# Patient Record
Sex: Male | Born: 1980 | Race: Black or African American | Hispanic: No | Marital: Married | Smoking: Former smoker
Health system: Southern US, Community
[De-identification: ages and names within clinical notes are randomized; demographics above are authoritative.]

## PROBLEM LIST (undated history)

## (undated) DIAGNOSIS — R519 Headache, unspecified: Secondary | ICD-10-CM

## (undated) DIAGNOSIS — F419 Anxiety disorder, unspecified: Secondary | ICD-10-CM

## (undated) DIAGNOSIS — I10 Essential (primary) hypertension: Secondary | ICD-10-CM

## (undated) DIAGNOSIS — R51 Headache: Secondary | ICD-10-CM

## (undated) DIAGNOSIS — K219 Gastro-esophageal reflux disease without esophagitis: Secondary | ICD-10-CM

## (undated) HISTORY — PX: WISDOM TOOTH EXTRACTION: SHX21

---
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
## 2002-03-06 ENCOUNTER — Encounter: Payer: Self-pay | Admitting: Cardiology

## 2002-03-06 ENCOUNTER — Ambulatory Visit (HOSPITAL_COMMUNITY): Admission: RE | Admit: 2002-03-06 | Discharge: 2002-03-06 | Payer: Self-pay | Admitting: Cardiology

## 2002-03-06 ENCOUNTER — Inpatient Hospital Stay (HOSPITAL_COMMUNITY): Admission: EM | Admit: 2002-03-06 | Discharge: 2002-03-11 | Payer: Self-pay | Admitting: *Deleted

## 2002-03-12 ENCOUNTER — Encounter (HOSPITAL_COMMUNITY): Admission: RE | Admit: 2002-03-12 | Discharge: 2002-06-10 | Payer: Self-pay | Admitting: Cardiology

## 2002-05-11 ENCOUNTER — Encounter: Admission: RE | Admit: 2002-05-11 | Discharge: 2002-05-11 | Payer: Self-pay | Admitting: Internal Medicine

## 2002-05-21 ENCOUNTER — Encounter: Admission: RE | Admit: 2002-05-21 | Discharge: 2002-05-21 | Payer: Self-pay | Admitting: Internal Medicine

## 2002-06-01 ENCOUNTER — Encounter: Admission: RE | Admit: 2002-06-01 | Discharge: 2002-06-01 | Payer: Self-pay | Admitting: Internal Medicine

## 2002-06-08 ENCOUNTER — Encounter: Admission: RE | Admit: 2002-06-08 | Discharge: 2002-06-08 | Payer: Self-pay | Admitting: Internal Medicine

## 2002-06-22 ENCOUNTER — Encounter: Admission: RE | Admit: 2002-06-22 | Discharge: 2002-06-22 | Payer: Self-pay | Admitting: Internal Medicine

## 2002-07-06 ENCOUNTER — Encounter: Admission: RE | Admit: 2002-07-06 | Discharge: 2002-07-06 | Payer: Self-pay | Admitting: Internal Medicine

## 2002-08-03 ENCOUNTER — Encounter: Admission: RE | Admit: 2002-08-03 | Discharge: 2002-08-03 | Payer: Self-pay | Admitting: Internal Medicine

## 2002-09-07 ENCOUNTER — Encounter: Admission: RE | Admit: 2002-09-07 | Discharge: 2002-09-07 | Payer: Self-pay | Admitting: Internal Medicine

## 2002-11-05 ENCOUNTER — Encounter: Admission: RE | Admit: 2002-11-05 | Discharge: 2002-11-05 | Payer: Self-pay | Admitting: Internal Medicine

## 2002-12-31 ENCOUNTER — Encounter: Admission: RE | Admit: 2002-12-31 | Discharge: 2002-12-31 | Payer: Self-pay | Admitting: Internal Medicine

## 2003-02-03 ENCOUNTER — Emergency Department (HOSPITAL_COMMUNITY): Admission: EM | Admit: 2003-02-03 | Discharge: 2003-02-03 | Payer: Self-pay | Admitting: Emergency Medicine

## 2003-04-14 ENCOUNTER — Encounter: Admission: RE | Admit: 2003-04-14 | Discharge: 2003-04-14 | Payer: Self-pay | Admitting: Internal Medicine

## 2003-04-26 ENCOUNTER — Encounter: Admission: RE | Admit: 2003-04-26 | Discharge: 2003-04-26 | Payer: Self-pay | Admitting: Cardiology

## 2003-04-26 ENCOUNTER — Encounter: Payer: Self-pay | Admitting: Cardiology

## 2003-06-13 ENCOUNTER — Emergency Department (HOSPITAL_COMMUNITY): Admission: EM | Admit: 2003-06-13 | Discharge: 2003-06-13 | Payer: Self-pay | Admitting: Emergency Medicine

## 2003-06-13 ENCOUNTER — Encounter: Payer: Self-pay | Admitting: Emergency Medicine

## 2003-08-30 ENCOUNTER — Encounter: Admission: RE | Admit: 2003-08-30 | Discharge: 2003-08-30 | Payer: Self-pay | Admitting: Internal Medicine

## 2005-07-15 ENCOUNTER — Emergency Department (HOSPITAL_COMMUNITY): Admission: EM | Admit: 2005-07-15 | Discharge: 2005-07-15 | Payer: Self-pay | Admitting: Emergency Medicine

## 2006-11-18 ENCOUNTER — Emergency Department (HOSPITAL_COMMUNITY): Admission: EM | Admit: 2006-11-18 | Discharge: 2006-11-18 | Payer: Self-pay | Admitting: Emergency Medicine

## 2006-11-27 ENCOUNTER — Ambulatory Visit: Payer: Self-pay | Admitting: Pulmonary Disease

## 2006-11-27 LAB — CONVERTED CEMR LAB
Albumin: 4.1 g/dL (ref 3.5–5.2)
Basophils Absolute: 0 10*3/uL (ref 0.0–0.1)
Bilirubin, Direct: 0.2 mg/dL (ref 0.0–0.3)
CO2: 32 meq/L (ref 19–32)
Calcium: 9.8 mg/dL (ref 8.4–10.5)
Creatinine, Ser: 0.9 mg/dL (ref 0.4–1.5)
Eosinophils Relative: 2.7 % (ref 0.0–5.0)
GFR calc Af Amer: 131 mL/min
GFR calc non Af Amer: 108 mL/min
Glucose, Bld: 145 mg/dL — ABNORMAL HIGH (ref 70–99)
Ketones, ur: NEGATIVE mg/dL
LDL Cholesterol: 144 mg/dL — ABNORMAL HIGH (ref 0–99)
Leukocytes, UA: NEGATIVE
Monocytes Absolute: 0.5 10*3/uL (ref 0.2–0.7)
Monocytes Relative: 9.2 % (ref 3.0–11.0)
Neutrophils Relative %: 45.9 % (ref 43.0–77.0)
Platelets: 298 10*3/uL (ref 150–400)
Potassium: 4.5 meq/L (ref 3.5–5.1)
RBC: 5.3 M/uL (ref 4.22–5.81)
RDW: 13.1 % (ref 11.5–14.6)
Sodium: 142 meq/L (ref 135–145)
Total Bilirubin: 1.4 mg/dL — ABNORMAL HIGH (ref 0.3–1.2)
Total Protein: 7.3 g/dL (ref 6.0–8.3)
Triglycerides: 75 mg/dL (ref 0–149)
Urine Glucose: NEGATIVE mg/dL
Urobilinogen, UA: 0.2 (ref 0.0–1.0)
WBC: 5.4 10*3/uL (ref 4.5–10.5)

## 2007-04-08 ENCOUNTER — Emergency Department (HOSPITAL_COMMUNITY): Admission: EM | Admit: 2007-04-08 | Discharge: 2007-04-08 | Payer: Self-pay | Admitting: Emergency Medicine

## 2007-04-09 ENCOUNTER — Emergency Department (HOSPITAL_COMMUNITY): Admission: EM | Admit: 2007-04-09 | Discharge: 2007-04-09 | Payer: Self-pay | Admitting: Emergency Medicine

## 2008-03-17 ENCOUNTER — Ambulatory Visit: Payer: Self-pay | Admitting: Internal Medicine

## 2008-03-17 DIAGNOSIS — I10 Essential (primary) hypertension: Secondary | ICD-10-CM | POA: Insufficient documentation

## 2008-03-17 DIAGNOSIS — M549 Dorsalgia, unspecified: Secondary | ICD-10-CM | POA: Insufficient documentation

## 2008-03-18 LAB — CONVERTED CEMR LAB
AST: 23 units/L (ref 0–37)
BUN: 10 mg/dL (ref 6–23)
Bilirubin Urine: NEGATIVE
Bilirubin, Direct: 0.2 mg/dL (ref 0.0–0.3)
Calcium: 8.5 mg/dL (ref 8.4–10.5)
Creatinine, Ser: 1.1 mg/dL (ref 0.4–1.5)
Eosinophils Relative: 2.4 % (ref 0.0–5.0)
GFR calc Af Amer: 103 mL/min
GFR calc non Af Amer: 85 mL/min
Hemoglobin, Urine: NEGATIVE
MCHC: 34.2 g/dL (ref 30.0–36.0)
Monocytes Absolute: 0.4 10*3/uL (ref 0.1–1.0)
Monocytes Relative: 7.3 % (ref 3.0–12.0)
Neutro Abs: 2.1 10*3/uL (ref 1.4–7.7)
Platelets: 258 10*3/uL (ref 150–400)
Potassium: 3.8 meq/L (ref 3.5–5.1)
RBC: 4.61 M/uL (ref 4.22–5.81)
Urine Glucose: NEGATIVE mg/dL
WBC: 5 10*3/uL (ref 4.5–10.5)

## 2008-05-07 ENCOUNTER — Ambulatory Visit: Payer: Self-pay | Admitting: Pulmonary Disease

## 2008-05-07 DIAGNOSIS — R748 Abnormal levels of other serum enzymes: Secondary | ICD-10-CM | POA: Insufficient documentation

## 2008-05-07 DIAGNOSIS — E663 Overweight: Secondary | ICD-10-CM | POA: Insufficient documentation

## 2008-05-07 DIAGNOSIS — E7800 Pure hypercholesterolemia, unspecified: Secondary | ICD-10-CM | POA: Insufficient documentation

## 2008-05-19 ENCOUNTER — Emergency Department (HOSPITAL_COMMUNITY): Admission: EM | Admit: 2008-05-19 | Discharge: 2008-05-19 | Payer: Self-pay | Admitting: Emergency Medicine

## 2008-09-04 ENCOUNTER — Emergency Department (HOSPITAL_COMMUNITY): Admission: EM | Admit: 2008-09-04 | Discharge: 2008-09-04 | Payer: Self-pay | Admitting: Emergency Medicine

## 2008-09-24 ENCOUNTER — Ambulatory Visit: Payer: Self-pay | Admitting: Gastroenterology

## 2008-09-24 DIAGNOSIS — K219 Gastro-esophageal reflux disease without esophagitis: Secondary | ICD-10-CM | POA: Insufficient documentation

## 2008-09-27 LAB — CONVERTED CEMR LAB
AST: 20 units/L (ref 0–37)
Albumin: 4.2 g/dL (ref 3.5–5.2)
Alkaline Phosphatase: 52 units/L (ref 39–117)
Bilirubin, Direct: 0.2 mg/dL (ref 0.0–0.3)
Total Bilirubin: 1.5 mg/dL — ABNORMAL HIGH (ref 0.3–1.2)

## 2008-09-30 ENCOUNTER — Ambulatory Visit: Payer: Self-pay | Admitting: Cardiology

## 2008-11-02 ENCOUNTER — Telehealth (INDEPENDENT_AMBULATORY_CARE_PROVIDER_SITE_OTHER): Payer: Self-pay | Admitting: *Deleted

## 2008-11-16 ENCOUNTER — Ambulatory Visit: Payer: Self-pay | Admitting: Internal Medicine

## 2008-11-23 DIAGNOSIS — J069 Acute upper respiratory infection, unspecified: Secondary | ICD-10-CM | POA: Insufficient documentation

## 2008-12-08 ENCOUNTER — Telehealth: Payer: Self-pay | Admitting: Pulmonary Disease

## 2009-01-28 ENCOUNTER — Telehealth (INDEPENDENT_AMBULATORY_CARE_PROVIDER_SITE_OTHER): Payer: Self-pay | Admitting: *Deleted

## 2009-01-28 ENCOUNTER — Telehealth: Payer: Self-pay | Admitting: Gastroenterology

## 2009-01-28 ENCOUNTER — Ambulatory Visit: Payer: Self-pay | Admitting: Gastroenterology

## 2009-01-31 LAB — CONVERTED CEMR LAB
Basophils Absolute: 0 10*3/uL (ref 0.0–0.1)
HCT: 43.9 % (ref 39.0–52.0)
Lymphocytes Relative: 47.4 % — ABNORMAL HIGH (ref 12.0–46.0)
Lymphs Abs: 1.6 10*3/uL (ref 0.7–4.0)
Monocytes Absolute: 0.9 10*3/uL (ref 0.1–1.0)
Platelets: 187 10*3/uL (ref 150.0–400.0)
RBC: 5.01 M/uL (ref 4.22–5.81)
WBC: 3.5 10*3/uL — ABNORMAL LOW (ref 4.5–10.5)

## 2009-03-01 ENCOUNTER — Ambulatory Visit: Payer: Self-pay | Admitting: Pulmonary Disease

## 2009-03-01 DIAGNOSIS — F411 Generalized anxiety disorder: Secondary | ICD-10-CM | POA: Insufficient documentation

## 2009-03-15 ENCOUNTER — Ambulatory Visit: Payer: Self-pay | Admitting: Internal Medicine

## 2009-03-15 DIAGNOSIS — S139XXA Sprain of joints and ligaments of unspecified parts of neck, initial encounter: Secondary | ICD-10-CM | POA: Insufficient documentation

## 2009-03-16 ENCOUNTER — Telehealth (INDEPENDENT_AMBULATORY_CARE_PROVIDER_SITE_OTHER): Payer: Self-pay | Admitting: *Deleted

## 2009-03-16 LAB — CONVERTED CEMR LAB
Alkaline Phosphatase: 57 units/L (ref 39–117)
BUN: 14 mg/dL (ref 6–23)
Basophils Relative: 0.7 % (ref 0.0–3.0)
Chloride: 110 meq/L (ref 96–112)
Creatinine, Ser: 1 mg/dL (ref 0.4–1.5)
GFR calc non Af Amer: 114.12 mL/min (ref >60)
Glucose, Bld: 108 mg/dL — ABNORMAL HIGH (ref 70–99)
HCT: 42.5 % (ref 39.0–52.0)
Lymphocytes Relative: 45.8 % (ref 12.0–46.0)
MCV: 87.5 fL (ref 78.0–100.0)
Monocytes Relative: 7.9 % (ref 3.0–12.0)
Platelets: 239 10*3/uL (ref 150.0–400.0)
Potassium: 4.5 meq/L (ref 3.5–5.1)
RBC: 4.86 M/uL (ref 4.22–5.81)
RDW: 12.9 % (ref 11.5–14.6)
TSH: 0.29 microintl units/mL — ABNORMAL LOW (ref 0.35–5.50)
WBC: 3.9 10*3/uL — ABNORMAL LOW (ref 4.5–10.5)

## 2009-03-17 ENCOUNTER — Ambulatory Visit: Payer: Self-pay | Admitting: Pulmonary Disease

## 2009-03-17 LAB — CONVERTED CEMR LAB: Free T4: 0.6 ng/dL (ref 0.6–1.6)

## 2009-03-29 ENCOUNTER — Ambulatory Visit: Payer: Self-pay | Admitting: Pulmonary Disease

## 2009-04-05 DIAGNOSIS — J309 Allergic rhinitis, unspecified: Secondary | ICD-10-CM | POA: Insufficient documentation

## 2009-05-05 ENCOUNTER — Telehealth: Payer: Self-pay | Admitting: Pulmonary Disease

## 2009-07-11 ENCOUNTER — Telehealth (INDEPENDENT_AMBULATORY_CARE_PROVIDER_SITE_OTHER): Payer: Self-pay | Admitting: *Deleted

## 2009-07-14 ENCOUNTER — Encounter: Payer: Self-pay | Admitting: Adult Health

## 2009-07-14 ENCOUNTER — Ambulatory Visit: Payer: Self-pay | Admitting: Pulmonary Disease

## 2009-07-14 ENCOUNTER — Ambulatory Visit: Payer: Self-pay | Admitting: Internal Medicine

## 2009-07-14 LAB — CONVERTED CEMR LAB
AST: 22 units/L (ref 0–37)
Alkaline Phosphatase: 45 units/L (ref 39–117)
Basophils Absolute: 0 10*3/uL (ref 0.0–0.1)
Basophils Relative: 0.3 % (ref 0.0–3.0)
Bilirubin Urine: NEGATIVE
Bilirubin, Direct: 0.2 mg/dL (ref 0.0–0.3)
Calcium: 9.2 mg/dL (ref 8.4–10.5)
Cholesterol: 150 mg/dL (ref 0–200)
GFR calc non Af Amer: 113.85 mL/min (ref >60)
Glucose, Bld: 118 mg/dL — ABNORMAL HIGH (ref 70–99)
HDL: 31.6 mg/dL — ABNORMAL LOW (ref >39.00)
Hemoglobin, Urine: NEGATIVE
Ketones, ur: NEGATIVE mg/dL
LDL Cholesterol: 112 mg/dL — ABNORMAL HIGH (ref 0–99)
Leukocytes, UA: NEGATIVE
Lymphocytes Relative: 38.9 % (ref 12.0–46.0)
Lymphs Abs: 1.6 10*3/uL (ref 0.7–4.0)
MCV: 88.6 fL (ref 78.0–100.0)
Monocytes Relative: 7.6 % (ref 3.0–12.0)
Neutro Abs: 2.1 10*3/uL (ref 1.4–7.7)
RBC: 4.84 M/uL (ref 4.22–5.81)
Sodium: 141 meq/L (ref 135–145)
Total Protein, Urine: NEGATIVE mg/dL
Triglycerides: 30 mg/dL (ref 0.0–149.0)
Urine Glucose: NEGATIVE mg/dL
Urobilinogen, UA: 1 (ref 0.0–1.0)
VLDL: 6 mg/dL (ref 0.0–40.0)
pH: 6 (ref 5.0–8.0)

## 2009-07-18 DIAGNOSIS — IMO0002 Reserved for concepts with insufficient information to code with codable children: Secondary | ICD-10-CM | POA: Insufficient documentation

## 2009-08-05 ENCOUNTER — Telehealth (INDEPENDENT_AMBULATORY_CARE_PROVIDER_SITE_OTHER): Payer: Self-pay | Admitting: *Deleted

## 2009-08-08 ENCOUNTER — Emergency Department (HOSPITAL_COMMUNITY): Admission: EM | Admit: 2009-08-08 | Discharge: 2009-08-08 | Payer: Self-pay | Admitting: Emergency Medicine

## 2009-08-16 ENCOUNTER — Telehealth: Payer: Self-pay | Admitting: Pulmonary Disease

## 2009-10-03 ENCOUNTER — Telehealth (INDEPENDENT_AMBULATORY_CARE_PROVIDER_SITE_OTHER): Payer: Self-pay | Admitting: *Deleted

## 2009-10-31 ENCOUNTER — Telehealth: Payer: Self-pay | Admitting: Pulmonary Disease

## 2009-11-14 ENCOUNTER — Encounter: Payer: Self-pay | Admitting: Adult Health

## 2009-11-14 ENCOUNTER — Ambulatory Visit: Payer: Self-pay | Admitting: Pulmonary Disease

## 2009-11-14 DIAGNOSIS — J019 Acute sinusitis, unspecified: Secondary | ICD-10-CM | POA: Insufficient documentation

## 2009-11-15 ENCOUNTER — Telehealth (INDEPENDENT_AMBULATORY_CARE_PROVIDER_SITE_OTHER): Payer: Self-pay | Admitting: *Deleted

## 2009-12-21 ENCOUNTER — Ambulatory Visit: Payer: Self-pay | Admitting: Pulmonary Disease

## 2010-01-02 ENCOUNTER — Ambulatory Visit: Payer: Self-pay | Admitting: Professional

## 2010-01-05 ENCOUNTER — Telehealth (INDEPENDENT_AMBULATORY_CARE_PROVIDER_SITE_OTHER): Payer: Self-pay | Admitting: *Deleted

## 2010-01-06 ENCOUNTER — Encounter: Payer: Self-pay | Admitting: Adult Health

## 2010-01-20 ENCOUNTER — Encounter: Payer: Self-pay | Admitting: Adult Health

## 2010-03-07 ENCOUNTER — Ambulatory Visit: Payer: Self-pay | Admitting: Pulmonary Disease

## 2010-03-10 LAB — CONVERTED CEMR LAB
CO2: 31 meq/L (ref 19–32)
GFR calc non Af Amer: 123.24 mL/min (ref >60)
Hgb A1c MFr Bld: 6.9 % — ABNORMAL HIGH (ref 4.6–6.5)

## 2010-03-14 ENCOUNTER — Telehealth (INDEPENDENT_AMBULATORY_CARE_PROVIDER_SITE_OTHER): Payer: Self-pay | Admitting: *Deleted

## 2010-06-11 ENCOUNTER — Emergency Department (HOSPITAL_COMMUNITY): Admission: EM | Admit: 2010-06-11 | Discharge: 2010-06-11 | Payer: Self-pay | Admitting: Emergency Medicine

## 2010-06-18 IMAGING — CR DG CHEST 2V
2 series · 2 of 2 positions shown · non-contrast
Comparison: 11/18/2006

CLINICAL DATA: Pain.

CHEST - 2 VIEW

[w chest pa]
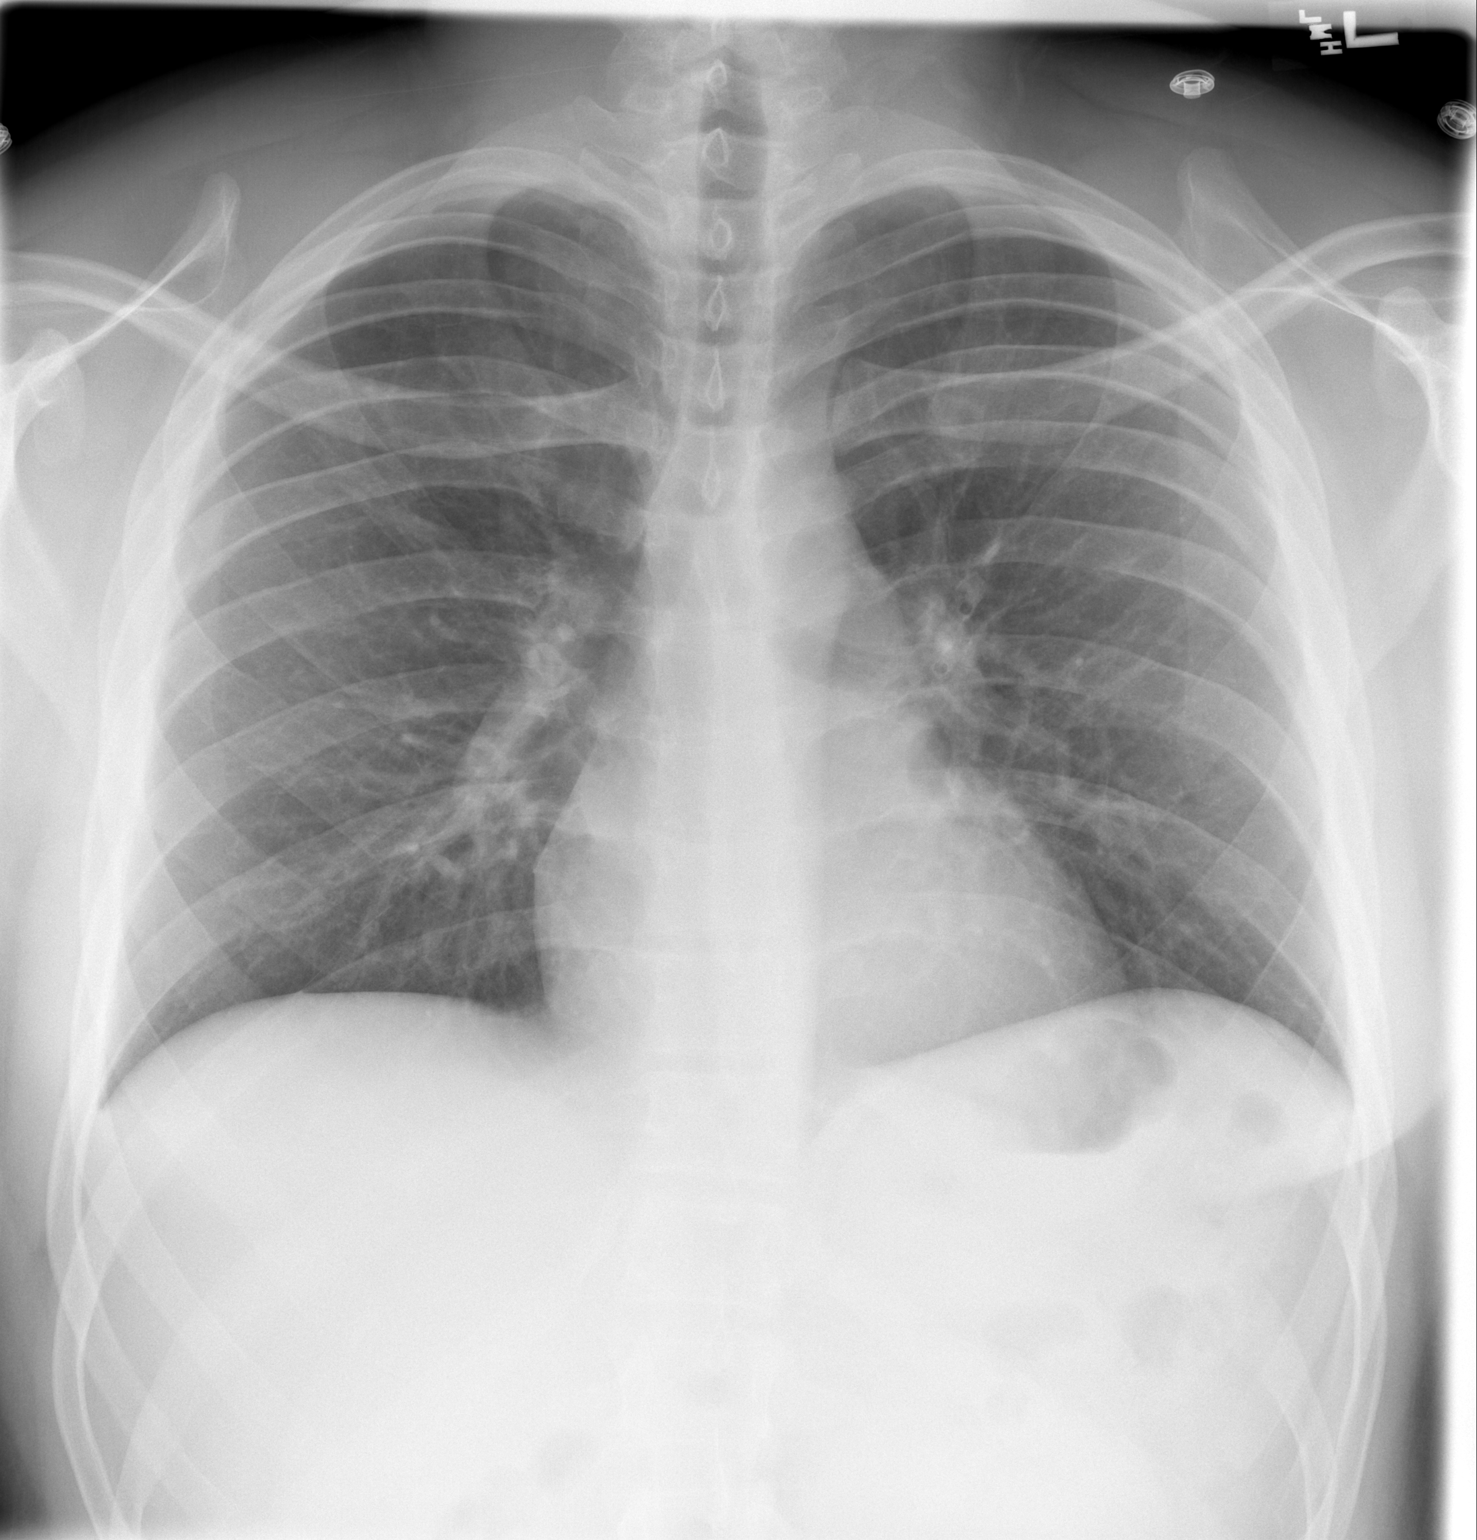

[w chest lat]
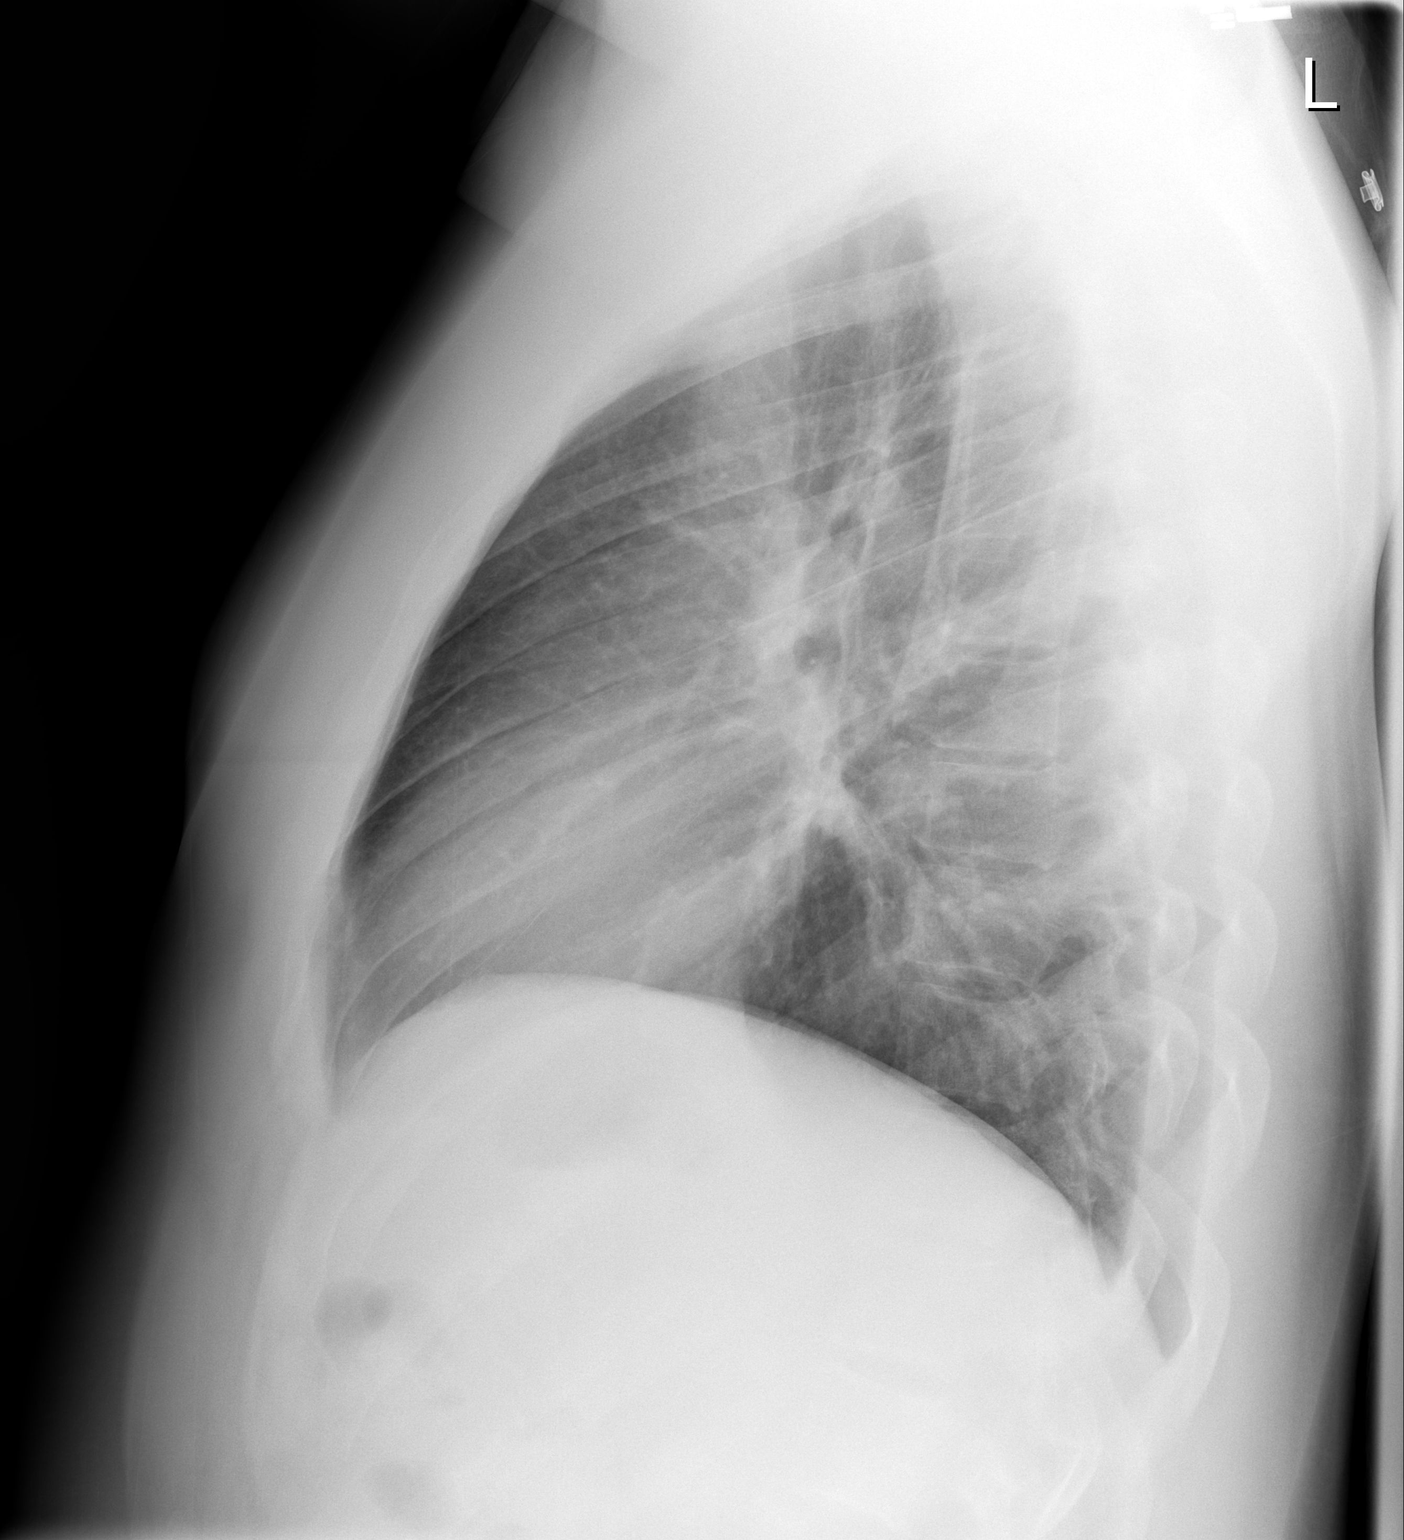

[2 of 2 positions shown; findings below may reference images not displayed]

FINDINGS: The cardiomediastinal silhouette is unremarkable.
The lungs are clear.
There is no evidence of focal airspace disease, pleural effusions,
or pneumothorax.
No acute bony abnormalities are identified.
IMPRESSION: No evidence of acute cardiopulmonary disease.

## 2010-08-29 ENCOUNTER — Telehealth: Payer: Self-pay | Admitting: Pulmonary Disease
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
## 2010-11-14 NOTE — Progress Notes (Signed)
Summary: TALK TO NURSE  Phone Note Call from Patient   Caller: Patient Call For: NADEL Summary of Call: PT WOULD LIKE TO TALK TO NURSE ABOUT LABS ON A1C AND DR NADEL'S COMMENTS Initial call taken by: Rickard Patience,  Mar 14, 2010 3:02 PM  Follow-up for Phone Call        unable to reach pt at either number.  will try back. Randell Loop CMA  Mar 14, 2010 4:27 PM    called and spoke with pt and he is aware that the metformin has been called to the pharmacy Randell Loop CMA  March 15, 2010 5:12 PM
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
## 2010-11-14 NOTE — Progress Notes (Signed)
Summary: medication  Phone Note Call from Patient   Caller: Patient Call For: nadel Summary of Call: med was not called to pharmacy from yesterday's ov [facility] Initial call taken by: Rickard Patience,  November 15, 2009 8:48 AM  Follow-up for Phone Call        pt just saw TP yesterday 11-14-2009.  Per pt instructions, pt was to take Omnicef 300mg  1 two times a day for 10 days.  Rx was never called in.  LMOM informing pt rx called into pharmacy.  Aundra Millet Reynolds LPN  November 15, 2009 8:58 AM     New/Updated Medications: CEFDINIR 300 MG CAPS (CEFDINIR) Take 1 tablet by mouth two times a day for 10 days Prescriptions: CEFDINIR 300 MG CAPS (CEFDINIR) Take 1 tablet by mouth two times a day for 10 days  #20 x 0   Entered by:   Arman Filter LPN   Authorized by:   Rubye Oaks NP   Signed by:   Arman Filter LPN on 09/81/1914   Method used:   Electronically to        [facility] (retail)       [address]       [place], [place]  [address]       Ph: [phone]       Fax: [phone]   RxID:   [id number]

## 2010-11-14 NOTE — Letter (Signed)
Summary: [facility]  [address]   [place], [place] [address]   Phone: [phone]  Fax: [phone]    01/06/2010  PATIENT NAME:   Scott Thomas   To Whom It May Concern,  The above named patient had a yearly physical appointment September 2010.  The physical exam, lab work and chest x-ray were unremarkable.     Sincerely,      Mysti Haley, N.P.

## 2010-11-14 NOTE — Progress Notes (Signed)
Summary: fax  Phone Note Call from Patient   Caller: Patient Call For: nadel Summary of Call: need letter stating he has had cpx and the results fax to his job at [phone] Initial call taken by: Rickard Patience,  January 05, 2010 12:04 PM  Follow-up for Phone Call        Pt needs a letter stating that he had a physical in Sept and what labs he had done and cxr and that all were normal. He needs this for his work. Pt states it can be faxed at number given when complete. Carron Curie CMA  January 05, 2010 3:02 PM   Additional Follow-up for Phone Call Additional follow up Details #1::        can set up letter, physical performed 9/10, phyiscal lab/cxr were unremarkable.   Additional Follow-up by: Rubye Oaks NP,  January 05, 2010 3:59 PM    Additional Follow-up for Phone Call Additional follow up Details #2::    letter typed and printed, stamped w/ TP's per (okayed per TP).  ATC fax letter, but # went to a voicemail.  ATC pt at work to verify fax #, LM w/ coworker TCB. Boone Master CNA  January 06, 2010 10:37 AM   attempted to fax letter again.  still directed to someone's VM.  ATC pt at work again, [employer] w/ coworker TCB again.   Boone Master CNA  January 09, 2010 2:55 PM   pt returned my call.  verified fax number and informed pt that it directs me to a VM.  pt stated he will find another fax number and call me back. Boone Master CNA  January 09, 2010 3:03 PM   i have still not heard from patient regarding a correct fax number for the letter that he has requested.  will sign off on message and await pt's call.  the signed letter will be kept at my desk in my "to do" folder. Boone Master CNA  January 11, 2010 9:20 AM

## 2010-11-14 NOTE — Letter (Signed)
Summary: Out of Work  [employer]  [address]   [place], [place] [address]   Phone: [phone]  Fax: [phone]    November 14, 2009   Employee:  Rodgerick A Allerton    To Whom It May Concern:   For Medical reasons, please excuse the above named employee from work for the following dates:  Start:   Monday November 14, 2009  End:   Tuesday November 15, 2009  If you need additional information, please feel free to contact our office.         Sincerely,      Tammy Parrett, N.P.

## 2010-11-14 NOTE — Progress Notes (Signed)
Summary: SINUS/ EARS  Phone Note Call from Patient   Caller: Patient Call For: Scott Thomas Summary of Call: PT C/O SINUS CONGESTION W/ EAR PRESSURE X 4 DAYS. NO FEVER. SAYS LAST TIME THIS HAPPENED TP CALLED IN A ZPAC WHICH WORKED WELL FOR PT. [facility] [address]. PT # [phone] Initial call taken by: Tivis Ringer, CNA,  August 29, 2010 2:53 PM  Follow-up for Phone Call        called spoke with patient who c/o sinus pressure/congestion and right ear pressure, light green nasal drainage, and PND x4days - denies f/c/s.  requests zpak as this has worked well for him in the past.  nkda.  [facility] [place].  last ov 5.24.11 w/ SN, upcoming 2.15.12. Boone Master CNA/MA  August 29, 2010 3:13 PM   Additional Follow-up for Phone Call Additional follow up Details #1::        per SN--ok for pt to have zpak #1  tad and use mucinex with plenty of fluids. called and spoke with pt and he is aware of meds sent to the pharmacy Randell Loop CMA  August 29, 2010 5:13 PM     New/Updated Medications: ZITHROMAX Z-PAK 250 MG TABS (AZITHROMYCIN) take as directed MUCINEX 600 MG XR12H-TAB (GUAIFENESIN) take one tablet by mouth two times a day with plenty of fluids Prescriptions: ZITHROMAX Z-PAK 250 MG TABS (AZITHROMYCIN) take as directed  #1 pak x 0   Entered by:   Randell Loop CMA   Authorized by:   Michele Mcalpine MD   Signed by:   Randell Loop CMA on 08/29/2010   Method used:   Electronically to        [facility] (retail)       [address]       [place], [place]  [address]       Ph: [phone]       Fax: [phone]   RxID:   [id number]

## 2010-11-14 NOTE — Progress Notes (Signed)
Summary: cough and congestion  Phone Note Call from Patient   Caller: Patient Call For: Scott Thomas Summary of Call: need something for cough green congestion [facility] [place] Initial call taken by: Rickard Patience,  October 31, 2009 9:39 AM  Follow-up for Phone Call        pt c/o nasal congestion, chest congestion with productive cough with green phlegm x 2 days. please advise. Carron Curie CMA  October 31, 2009 9:44 AM    per SN--ok for pt to have augmentin 875mg    #14  1 by mouth two times a day and use mucinex max 1 by mouth two times a day with plenty of fluids.  thanks Randell Loop CMA  October 31, 2009 12:28 PM   rx sent. pt advised. Carron Curie CMA  October 31, 2009 12:58 PM     New/Updated Medications: AUGMENTIN 875-125 MG TABS (AMOXICILLIN-POT CLAVULANATE) Take 1 tablet by mouth two times a day Prescriptions: AUGMENTIN 875-125 MG TABS (AMOXICILLIN-POT CLAVULANATE) Take 1 tablet by mouth two times a day  #14 x 0   Entered by:   Carron Curie CMA   Authorized by:   Michele Mcalpine MD   Signed by:   Carron Curie CMA on 10/31/2009   Method used:   Electronically to        [facility] (retail)       [address]       [place], [place]  [address]       Ph: [phone]       Fax: [phone]   RxID:   [id number]
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
## 2010-11-14 NOTE — Letter (Signed)
Summary: Physical exam form/[facility]  Physical exam form/[facility]   Imported By: Sherian Rein 02/01/2010 08:55:41  _____________________________________________________________________  External Attachment:    Type:   Image     Comment:   External Document

## 2010-11-29 ENCOUNTER — Ambulatory Visit (INDEPENDENT_AMBULATORY_CARE_PROVIDER_SITE_OTHER): Payer: Self-pay | Admitting: Pulmonary Disease

## 2010-11-29 ENCOUNTER — Encounter: Payer: Self-pay | Admitting: Pulmonary Disease

## 2010-11-29 DIAGNOSIS — I10 Essential (primary) hypertension: Secondary | ICD-10-CM

## 2010-11-29 DIAGNOSIS — F411 Generalized anxiety disorder: Secondary | ICD-10-CM

## 2010-11-29 DIAGNOSIS — K219 Gastro-esophageal reflux disease without esophagitis: Secondary | ICD-10-CM

## 2010-11-29 DIAGNOSIS — E663 Overweight: Secondary | ICD-10-CM

## 2010-11-29 DIAGNOSIS — R7309 Other abnormal glucose: Secondary | ICD-10-CM

## 2010-11-29 DIAGNOSIS — M549 Dorsalgia, unspecified: Secondary | ICD-10-CM

## 2010-11-29 DIAGNOSIS — E785 Hyperlipidemia, unspecified: Secondary | ICD-10-CM

## 2010-12-01 ENCOUNTER — Encounter (INDEPENDENT_AMBULATORY_CARE_PROVIDER_SITE_OTHER): Payer: Self-pay | Admitting: *Deleted

## 2010-12-01 ENCOUNTER — Other Ambulatory Visit: Payer: Self-pay

## 2010-12-01 ENCOUNTER — Other Ambulatory Visit: Payer: Self-pay | Admitting: Pulmonary Disease

## 2010-12-01 DIAGNOSIS — E119 Type 2 diabetes mellitus without complications: Secondary | ICD-10-CM

## 2010-12-01 DIAGNOSIS — D649 Anemia, unspecified: Secondary | ICD-10-CM

## 2010-12-01 DIAGNOSIS — E7800 Pure hypercholesterolemia, unspecified: Secondary | ICD-10-CM

## 2010-12-01 DIAGNOSIS — R748 Abnormal levels of other serum enzymes: Secondary | ICD-10-CM

## 2010-12-01 DIAGNOSIS — E039 Hypothyroidism, unspecified: Secondary | ICD-10-CM

## 2010-12-01 DIAGNOSIS — I10 Essential (primary) hypertension: Secondary | ICD-10-CM

## 2010-12-01 LAB — LIPID PANEL
LDL Cholesterol: 130 mg/dL — ABNORMAL HIGH (ref 0–99)
VLDL: 13.6 mg/dL (ref 0.0–40.0)

## 2010-12-01 LAB — CBC WITH DIFFERENTIAL/PLATELET
Basophils Relative: 0.5 % (ref 0.0–3.0)
Eosinophils Relative: 2.2 % (ref 0.0–5.0)
MCHC: 33.8 g/dL (ref 30.0–36.0)
MCV: 88.5 fl (ref 78.0–100.0)
Monocytes Relative: 8.7 % (ref 3.0–12.0)
Neutrophils Relative %: 46.9 % (ref 43.0–77.0)
RBC: 4.78 Mil/uL (ref 4.22–5.81)
RDW: 14.2 % (ref 11.5–14.6)
WBC: 4.4 10*3/uL — ABNORMAL LOW (ref 4.5–10.5)

## 2010-12-01 LAB — BASIC METABOLIC PANEL
Creatinine, Ser: 0.9 mg/dL (ref 0.4–1.5)
GFR: 129 mL/min (ref >60.00)
Glucose, Bld: 139 mg/dL — ABNORMAL HIGH (ref 70–99)

## 2010-12-01 LAB — HEPATIC FUNCTION PANEL
AST: 27 U/L (ref 0–37)
Albumin: 4.1 g/dL (ref 3.5–5.2)
Alkaline Phosphatase: 57 U/L (ref 39–117)

## 2010-12-01 LAB — HEMOGLOBIN A1C: Hgb A1c MFr Bld: 7.7 % — ABNORMAL HIGH (ref 4.6–6.5)

## 2010-12-09 DIAGNOSIS — E119 Type 2 diabetes mellitus without complications: Secondary | ICD-10-CM | POA: Insufficient documentation
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
## 2010-12-13 ENCOUNTER — Emergency Department (INDEPENDENT_AMBULATORY_CARE_PROVIDER_SITE_OTHER): Payer: Self-pay

## 2010-12-13 ENCOUNTER — Emergency Department (HOSPITAL_BASED_OUTPATIENT_CLINIC_OR_DEPARTMENT_OTHER)
Admission: EM | Admit: 2010-12-13 | Discharge: 2010-12-14 | Disposition: A | Discharge status: Home / Self Care | Payer: Self-pay | Attending: Emergency Medicine | Admitting: Emergency Medicine

## 2010-12-13 DIAGNOSIS — M25579 Pain in unspecified ankle and joints of unspecified foot: Secondary | ICD-10-CM

## 2010-12-13 DIAGNOSIS — S93409A Sprain of unspecified ligament of unspecified ankle, initial encounter: Secondary | ICD-10-CM | POA: Insufficient documentation

## 2010-12-13 DIAGNOSIS — F329 Major depressive disorder, single episode, unspecified: Secondary | ICD-10-CM | POA: Insufficient documentation

## 2010-12-13 DIAGNOSIS — F3289 Other specified depressive episodes: Secondary | ICD-10-CM | POA: Insufficient documentation

## 2010-12-13 DIAGNOSIS — F17200 Nicotine dependence, unspecified, uncomplicated: Secondary | ICD-10-CM | POA: Insufficient documentation

## 2010-12-13 DIAGNOSIS — I10 Essential (primary) hypertension: Secondary | ICD-10-CM | POA: Insufficient documentation

## 2010-12-13 DIAGNOSIS — K219 Gastro-esophageal reflux disease without esophagitis: Secondary | ICD-10-CM | POA: Insufficient documentation

## 2011-03-02 NOTE — Discharge Summary (Signed)
[facility]  Patient:    Scott Thomas Visit Number: [id number] MRN: [id number]          Service Type: REC Location: [facility] Attending Physician:  Pola Corn Dictated by:   Lyla Son. Achilles Dunk. Admit Date:  03/12/2002 Disc. Date: 03/11/02                             Discharge Summary  DISCHARGE DIAGNOSIS:  Deep vein thrombus, left leg.  HISTORY OF PRESENT ILLNESS:  This is a 30 year old male who had been noticing some swelling of his left lower extremity.  He was seen in the office, the swelling seemed to be getting worse and there was some increase in discomfort. The patient was sent for an outpatient Doppler study; it was found to be positive for DVT and he was subsequently admitted for evaluation of the same.  HOSPITAL COURSE:  The patient was admitted to the medical service.  He was seen by pharmacy for heparin and Coumadin protocol.  The patient had a CT scan which was negative for PE and positive for DVT.  He had a mostly uneventful hospital course after the patient had been heparinized and Coumadin had been started.  On Mar 11, 2002, the patients INR was in an acceptable range.  He has noted less leg swelling.  The patient is scheduled for regular INR checks as an outpatient.  He is scheduled to be seen in the office in one week and he is advised to notify the physician immediately if any changes, problems or concerns.  The patient has also been fit for compression hose to the calf. Dictated by:   Lyla Son. Achilles Dunk. Attending Physician:  Pola Corn DD:  04/07/02 TD:  04/08/02 Job: [id number]

## 2011-04-27 IMAGING — CR DG CHEST 2V
2 series · 2 of 2 positions shown · non-contrast
Comparison: 09/04/2008

CLINICAL DATA: Physical exam, ex-smoker, hypertension.

CHEST - 2 VIEW

[view not recorded (1 of 2)]
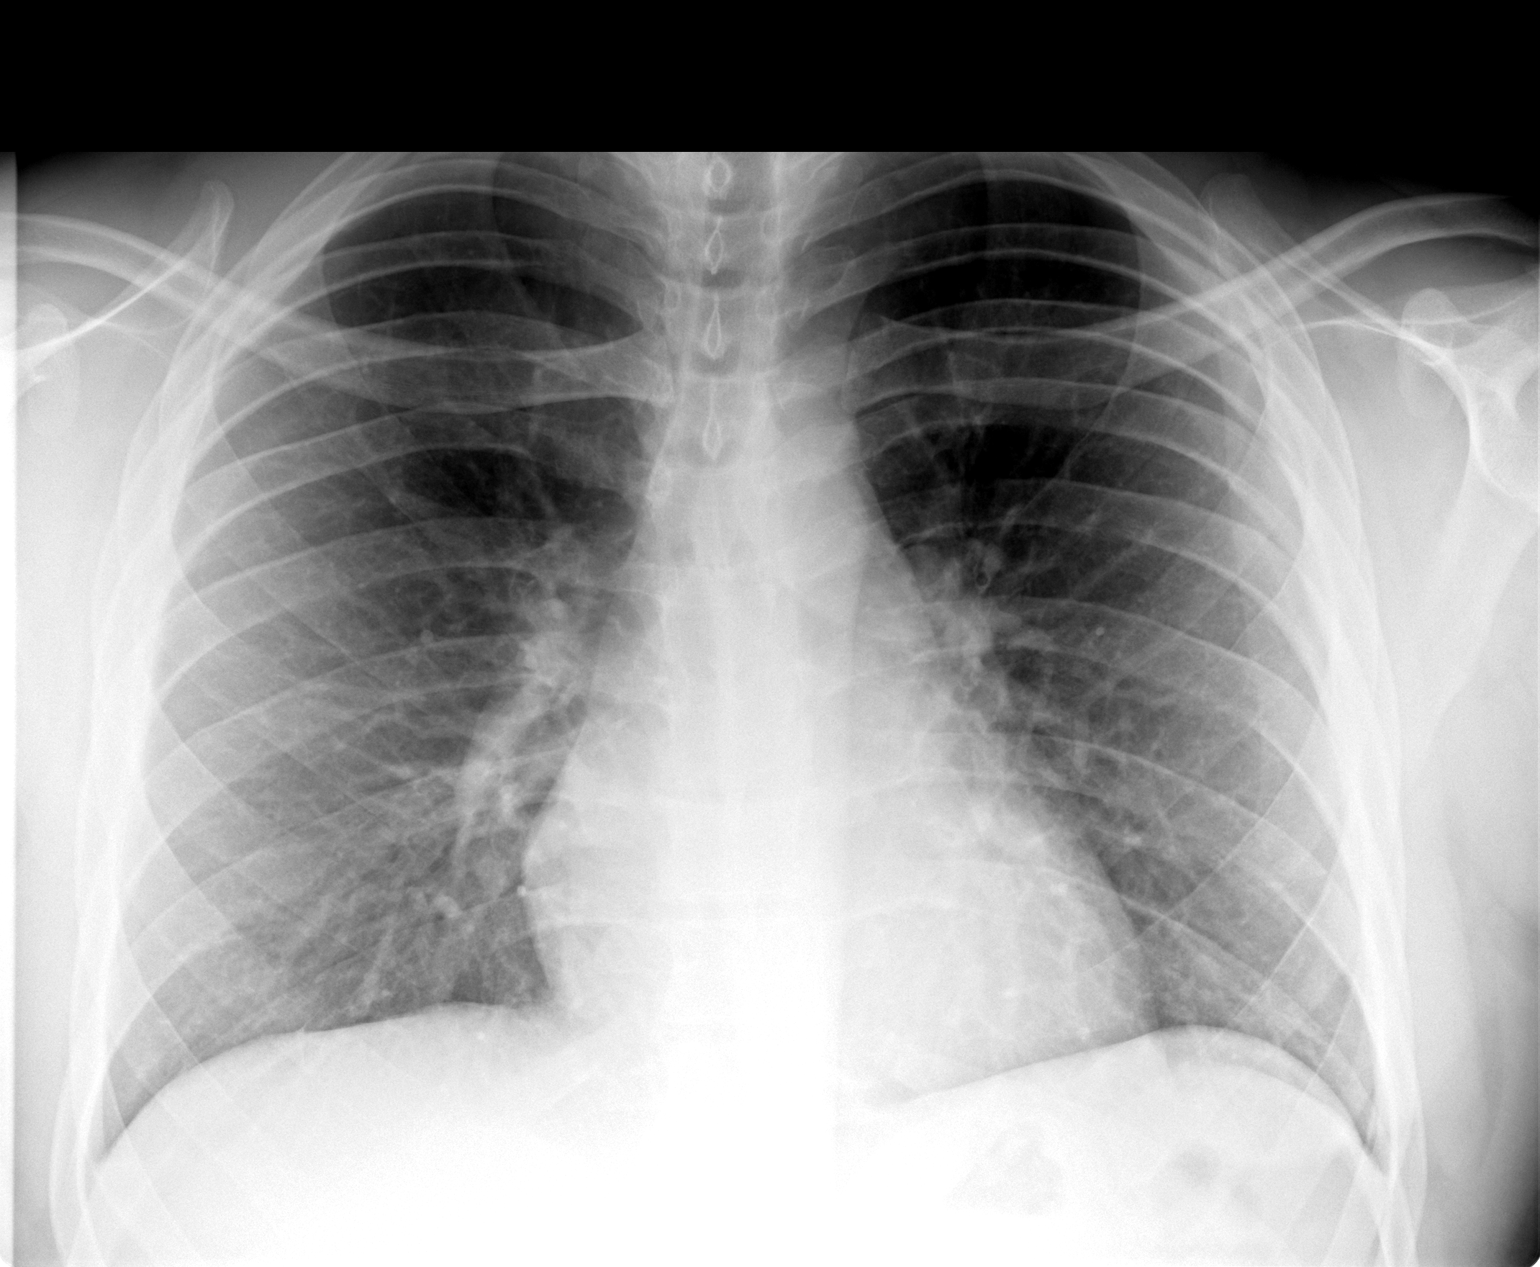

[view not recorded (2 of 2)]
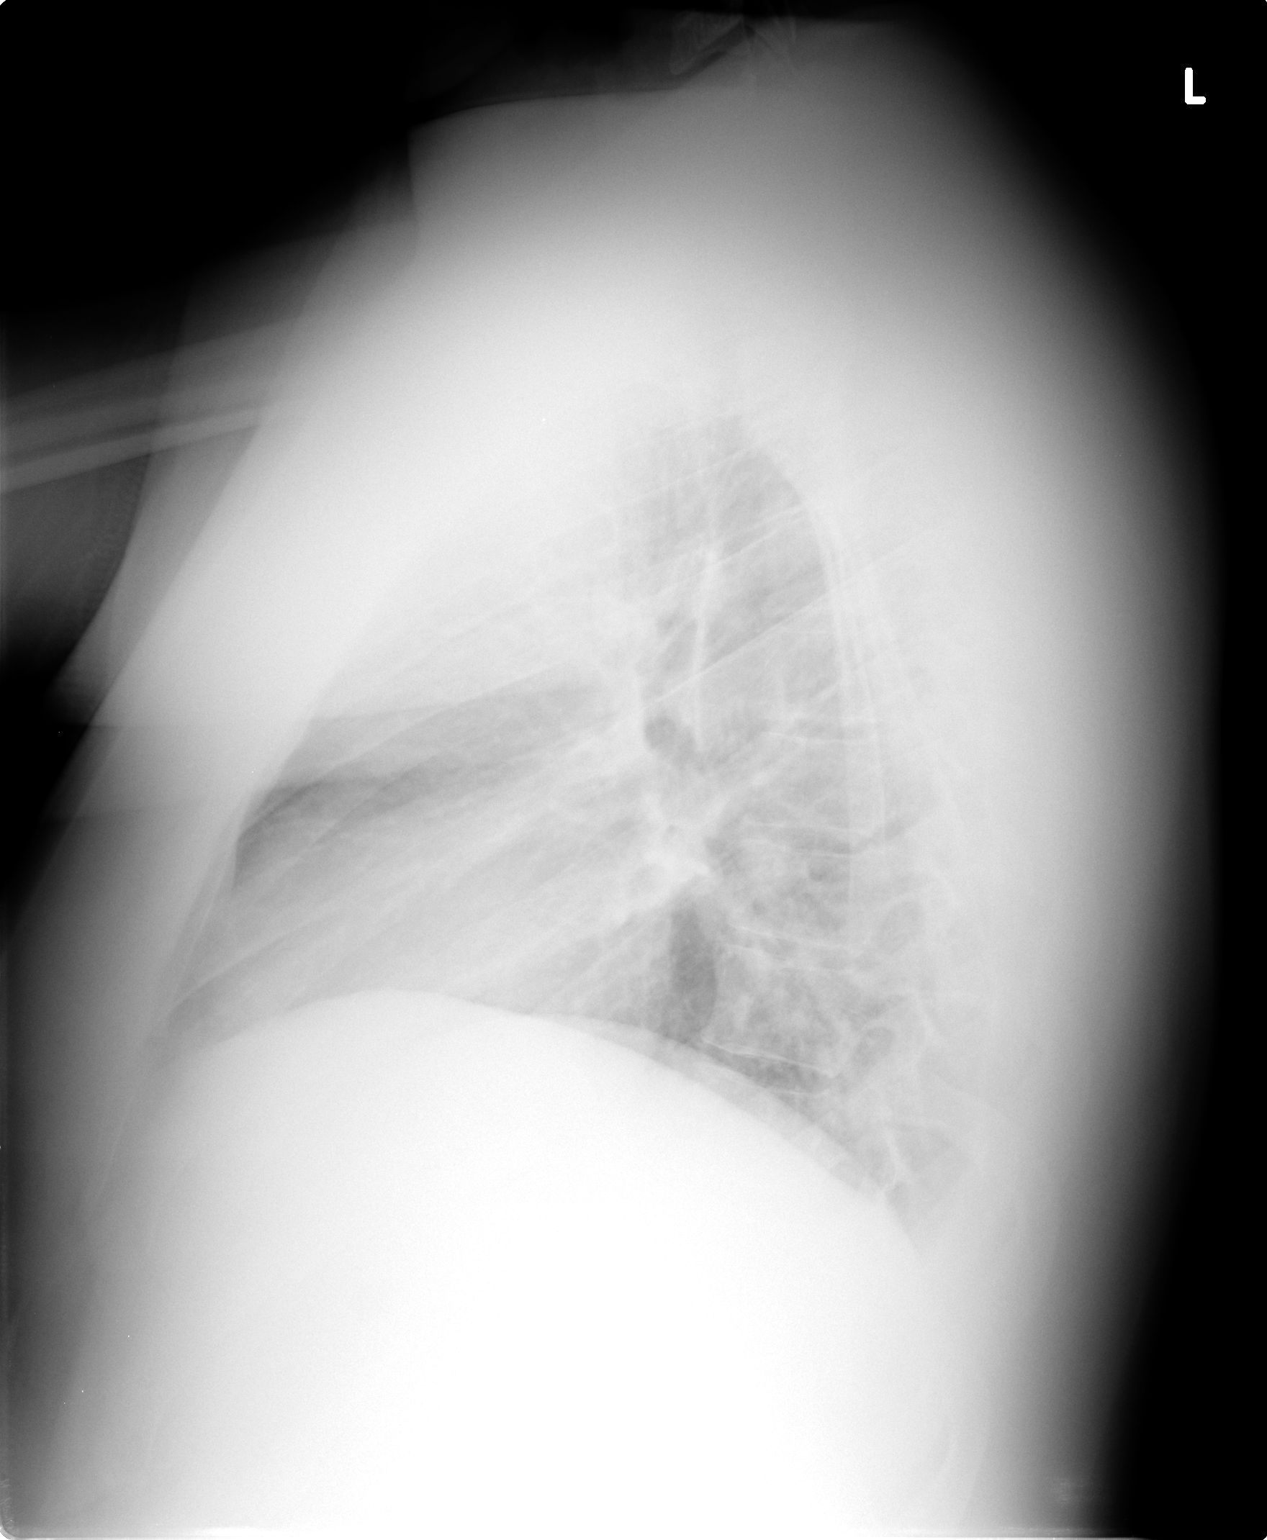

[2 of 2 positions shown; findings below may reference images not displayed]

FINDINGS: Heart and mediastinal contours are within normal limits.
No focal opacities or effusions.  No acute bony abnormality.
IMPRESSION: No active disease.

## 2011-05-10 ENCOUNTER — Telehealth: Payer: Self-pay | Admitting: Pulmonary Disease

## 2011-05-10 MED ORDER — AZITHROMYCIN 250 MG PO TABS: ORAL_TABLET | ORAL | Status: AC

## 2011-05-10 MED ORDER — DM-GUAIFENESIN ER 30-600 MG PO TB12: 1.0000 | ORAL_TABLET | Freq: Two times a day (BID) | ORAL | Status: AC

## 2011-05-10 NOTE — Telephone Encounter (Signed)
Called and spoke with pt.  Pt believes he has a sinus infection.  States symptoms started on Sunday.  C/o ears hurt, throat sore, coughing up green sputum, "raspy voice"  Chills/sweats, nasal congestion, pressure behind eyes, headache, and light headed.  Pt is requesting abx.  Please advise.  Thank you. Allergies  Allergen Reactions  . Metformin     REACTION: causes nausea

## 2011-05-10 NOTE — Telephone Encounter (Signed)
Per TP: zpak #1 take as directed, mucinex dm 1 every 12 hours as needed.  Follow up in the office if symptoms do not improve or worsen.  Called spoke with patient, advised of TP's recs as stated above.  Patient verbalized his understanding.  rx sent to [facility] on [address].

## 2011-07-17 LAB — DIFFERENTIAL
Basophils Absolute: 0
Basophils Relative: 1
Eosinophils Absolute: 0.1
Eosinophils Relative: 2
Lymphocytes Relative: 31
Lymphs Abs: 1.8
Monocytes Absolute: 0.4
Monocytes Relative: 7
Neutro Abs: 3.4
Neutrophils Relative %: 60

## 2011-07-17 LAB — POCT I-STAT, CHEM 8
BUN: 9
Calcium, Ion: 1.25
Chloride: 102
Creatinine, Ser: 1.1
Glucose, Bld: 102 — ABNORMAL HIGH
HCT: 46
Hemoglobin: 15.6
Potassium: 3.9
Sodium: 141
TCO2: 28

## 2011-07-17 LAB — URINALYSIS, ROUTINE W REFLEX MICROSCOPIC
Bilirubin Urine: NEGATIVE
Glucose, UA: NEGATIVE
Hgb urine dipstick: NEGATIVE
Ketones, ur: NEGATIVE
Nitrite: NEGATIVE
Protein, ur: NEGATIVE
Specific Gravity, Urine: 1.014
Urobilinogen, UA: 1
pH: 6.5

## 2011-07-17 LAB — CBC
HCT: 43.9
Hemoglobin: 14.5
MCHC: 33
MCV: 89.6
Platelets: 251
RBC: 4.9
RDW: 13.9
WBC: 5.7

## 2011-07-17 LAB — COMPREHENSIVE METABOLIC PANEL
ALT: 40
AST: 28
CO2: 27
Chloride: 106
Glucose, Bld: 97
Potassium: 3.9
Total Bilirubin: 1.3 — ABNORMAL HIGH
Total Protein: 7

## 2011-08-01 LAB — WOUND CULTURE

## 2011-12-19 ENCOUNTER — Other Ambulatory Visit: Payer: Self-pay | Admitting: Pulmonary Disease

## 2011-12-25 NOTE — Telephone Encounter (Signed)
Pt last seen 11/2010 and has no pending appts.  rx denied.  Pt needs to come in to be seen.

## 2011-12-26 ENCOUNTER — Other Ambulatory Visit: Payer: Self-pay | Admitting: Pulmonary Disease

## 2012-01-26 ENCOUNTER — Other Ambulatory Visit: Payer: Self-pay | Admitting: Pulmonary Disease

## 2012-01-30 ENCOUNTER — Other Ambulatory Visit: Payer: Self-pay | Admitting: Pulmonary Disease

## 2012-01-31 ENCOUNTER — Other Ambulatory Visit: Payer: Self-pay | Admitting: Pulmonary Disease

## 2012-02-19 ENCOUNTER — Other Ambulatory Visit: Payer: Self-pay | Admitting: Pulmonary Disease

## 2012-02-20 ENCOUNTER — Telehealth: Payer: Self-pay | Admitting: Pulmonary Disease

## 2012-02-20 MED ORDER — ATENOLOL 100 MG PO TABS: 100.0000 mg | ORAL_TABLET | Freq: Every day | ORAL | Status: DC

## 2012-02-20 NOTE — Telephone Encounter (Signed)
I left message on pts voicemail that a month supply has been sent to pharmacy but he MUST keep appt with SN to get medications. If any questions or concerns please call the office.

## 2012-03-04 ENCOUNTER — Other Ambulatory Visit: Payer: Self-pay | Admitting: Pulmonary Disease

## 2012-03-04 NOTE — Telephone Encounter (Signed)
Please advise if ok to refill, thanks 

## 2012-03-13 ENCOUNTER — Other Ambulatory Visit: Payer: Self-pay | Admitting: Pulmonary Disease

## 2012-03-15 ENCOUNTER — Other Ambulatory Visit: Payer: Self-pay | Admitting: Pulmonary Disease

## 2012-03-17 ENCOUNTER — Encounter (HOSPITAL_COMMUNITY): Payer: Self-pay | Admitting: *Deleted

## 2012-03-17 ENCOUNTER — Emergency Department (HOSPITAL_COMMUNITY)
Admission: EM | Admit: 2012-03-17 | Discharge: 2012-03-18 | Disposition: A | Discharge status: Home / Self Care | Payer: 59 | Attending: Emergency Medicine | Admitting: Emergency Medicine

## 2012-03-17 DIAGNOSIS — Z79899 Other long term (current) drug therapy: Secondary | ICD-10-CM | POA: Insufficient documentation

## 2012-03-17 DIAGNOSIS — E119 Type 2 diabetes mellitus without complications: Secondary | ICD-10-CM | POA: Insufficient documentation

## 2012-03-17 DIAGNOSIS — I10 Essential (primary) hypertension: Secondary | ICD-10-CM | POA: Insufficient documentation

## 2012-03-17 DIAGNOSIS — F17200 Nicotine dependence, unspecified, uncomplicated: Secondary | ICD-10-CM | POA: Insufficient documentation

## 2012-03-17 HISTORY — DX: Essential (primary) hypertension: I10

## 2012-03-17 LAB — COMPREHENSIVE METABOLIC PANEL
BUN: 13 mg/dL (ref 6–23)
CO2: 25 mEq/L (ref 19–32)
Calcium: 9.2 mg/dL (ref 8.4–10.5)
Creatinine, Ser: 0.94 mg/dL (ref 0.50–1.35)
GFR calc Af Amer: >90 mL/min (ref >90)
GFR calc non Af Amer: >90 mL/min (ref >90)
Glucose, Bld: 322 mg/dL — ABNORMAL HIGH (ref 70–99)
Total Protein: 7.1 g/dL (ref 6.0–8.3)

## 2012-03-17 LAB — CBC
HCT: 42.4 % (ref 39.0–52.0)
Hemoglobin: 14.5 g/dL (ref 13.0–17.0)
MCH: 29.1 pg (ref 26.0–34.0)
MCV: 85 fL (ref 78.0–100.0)
Platelets: 216 10*3/uL (ref 150–400)
RBC: 4.99 MIL/uL (ref 4.22–5.81)

## 2012-03-17 LAB — DIFFERENTIAL
Basophils Relative: 1 % (ref 0–1)
Eosinophils Relative: 2 % (ref 0–5)
Lymphs Abs: 2.8 10*3/uL (ref 0.7–4.0)
Monocytes Absolute: 0.6 10*3/uL (ref 0.1–1.0)
Monocytes Relative: 9 % (ref 3–12)
Neutro Abs: 3 10*3/uL (ref 1.7–7.7)

## 2012-03-17 LAB — GLUCOSE, CAPILLARY

## 2012-03-17 NOTE — ED Notes (Signed)
Pt c/o elevated glucose; glucose at home 364; pt c/o feeling thirsty; frequent urination x 2 days; takes glimepiride 1mg  x 1 year; cbg 311 in triage

## 2012-03-18 ENCOUNTER — Telehealth: Payer: Self-pay | Admitting: Pulmonary Disease

## 2012-03-18 LAB — URINALYSIS, ROUTINE W REFLEX MICROSCOPIC
Leukocytes, UA: NEGATIVE
Nitrite: NEGATIVE
Specific Gravity, Urine: >1.046 — ABNORMAL HIGH (ref 1.005–1.030)
Urobilinogen, UA: 1 mg/dL (ref 0.0–1.0)
pH: 6 (ref 5.0–8.0)

## 2012-03-18 LAB — GLUCOSE, CAPILLARY
Glucose-Capillary: 266 mg/dL — ABNORMAL HIGH (ref 70–99)
Glucose-Capillary: 275 mg/dL — ABNORMAL HIGH (ref 70–99)

## 2012-03-18 LAB — URINE MICROSCOPIC-ADD ON

## 2012-03-18 MED ORDER — INSULIN ASPART 100 UNIT/ML ~~LOC~~ SOLN
3.0000 [IU] | Freq: Once | SUBCUTANEOUS | Status: AC
  Administered 2012-03-18: 3 [IU] via SUBCUTANEOUS
  Filled 2012-03-18: qty 1

## 2012-03-18 MED ORDER — SODIUM CHLORIDE 0.9 % IV SOLN
1000.0000 mL | INTRAVENOUS | Status: DC
  Administered 2012-03-18: 1000 mL via INTRAVENOUS

## 2012-03-18 MED ORDER — METFORMIN HCL 850 MG PO TABS: 850.0000 mg | ORAL_TABLET | Freq: Every day | ORAL | Status: DC

## 2012-03-18 MED ORDER — METFORMIN HCL 850 MG PO TABS
850.0000 mg | ORAL_TABLET | Freq: Once | ORAL | Status: AC
  Administered 2012-03-18: 850 mg via ORAL
  Filled 2012-03-18: qty 1

## 2012-03-18 MED ORDER — GLIMEPIRIDE 4 MG PO TABS: 4.0000 mg | ORAL_TABLET | Freq: Every day | ORAL | Status: DC

## 2012-03-18 MED ORDER — SODIUM CHLORIDE 0.9 % IV BOLUS (SEPSIS)
1000.0000 mL | Freq: Once | INTRAVENOUS | Status: AC
  Administered 2012-03-18: 1000 mL via INTRAVENOUS

## 2012-03-18 MED ORDER — SODIUM CHLORIDE 0.9 % IV SOLN
1000.0000 mL | Freq: Once | INTRAVENOUS | Status: AC
  Administered 2012-03-18: 1000 mL via INTRAVENOUS

## 2012-03-18 MED ORDER — METFORMIN HCL 500 MG PO TABS: 500.0000 mg | ORAL_TABLET | Freq: Two times a day (BID) | ORAL | Status: DC

## 2012-03-18 NOTE — ED Provider Notes (Addendum)
History     CSN: [id number]  Arrival date & time 03/17/12  2200   First MD Initiated Contact with Patient 03/18/12 0017      Chief Complaint  Patient presents with  . Hyperglycemia    (Consider location/radiation/quality/duration/timing/severity/associated sxs/prior treatment) HPI Comments: Patient is a non-insulin-dependent diabetic.  He has not been following a very careful diet, drinking, sweet tea, as well as slushy is noticed on Saturday after drinking a "slushy."  He developed diarrhea and excessive thirst.  He asked his mother to check his blood sugar as he does not have a machine, and it was 364.  He has an appointment with Dr. Kriste Basque, June 26  The history is provided by the patient.    Past Medical History  Diagnosis Date  . Diabetes mellitus   . Hypertension     History reviewed. No pertinent past surgical history.  History reviewed. No pertinent family history.  History  Substance Use Topics  . Smoking status: Current Everyday Smoker -- 0.5 packs/day  . Smokeless tobacco: Not on file  . Alcohol Use: No      Review of Systems  Constitutional: Negative for fever, activity change, appetite change and unexpected weight change.  Genitourinary: Positive for frequency.  Neurological: Negative for weakness.    Allergies  Metformin  Home Medications   Current Outpatient Rx  Name Route Sig Dispense Refill  . ATENOLOL 100 MG PO TABS Oral Take 1 tablet (100 mg total) by mouth daily. 30 tablet 0  . GLIMEPIRIDE 1 MG PO TABS  TAKE ONE TABLET BY MOUTH IN THE MORNING 30 tablet 0  . OMEPRAZOLE 20 MG PO CPDR  TAKE ONE CAPSULE BY MOUTH EVERY DAY. NEEDS OFFICE VISIT WITH DR NADEL FOR FURTHER REFILLS 30 capsule 0  . SERTRALINE HCL 100 MG PO TABS  TAKE ONE TABLET BY MOUTH EVERY DAY 30 tablet 0  . METFORMIN HCL 850 MG PO TABS Oral Take 1 tablet (850 mg total) by mouth daily with breakfast. 30 tablet 0    BP 138/84  Pulse 72  Temp(Src) 98.2 F (36.8 C) (Oral)  Resp 18   SpO2 100%  Physical Exam  Constitutional: He is oriented to person, place, and time. He appears well-developed and well-nourished.  HENT:  Head: Normocephalic.  Eyes: Pupils are equal, round, and reactive to light.  Neck: Normal range of motion.  Cardiovascular: Normal rate.   Pulmonary/Chest: Effort normal.  Abdominal: Soft. He exhibits no distension. There is no tenderness.  Musculoskeletal: Normal range of motion.  Neurological: He is alert and oriented to person, place, and time.  Skin: Skin is warm.    ED Course  Procedures (including critical care time)  Labs Reviewed  GLUCOSE, CAPILLARY - Abnormal; Notable for the following:    Glucose-Capillary 311 (*)    All other components within normal limits  COMPREHENSIVE METABOLIC PANEL - Abnormal; Notable for the following:    Sodium 132 (*)    Glucose, Bld 322 (*)    All other components within normal limits  URINALYSIS, ROUTINE W REFLEX MICROSCOPIC - Abnormal; Notable for the following:    Specific Gravity, Urine >1.046 (*)    Glucose, UA >1000 (*)    Ketones, ur TRACE (*)    All other components within normal limits  GLUCOSE, CAPILLARY - Abnormal; Notable for the following:    Glucose-Capillary 297 (*)    All other components within normal limits  GLUCOSE, CAPILLARY - Abnormal; Notable for the following:    Glucose-Capillary  266 (*)    All other components within normal limits  GLUCOSE, CAPILLARY - Abnormal; Notable for the following:    Glucose-Capillary 275 (*)    All other components within normal limits  CBC  DIFFERENTIAL  URINE MICROSCOPIC-ADD ON   No results found.   1. DIABETES MELLITUS       MDM   Patient has been hydrated with 2 liters of NS and glucose has only dropped to 266 will try a small dose of SQ insulin and recheck in 30 minutes         Arman Filter, NP 03/18/12 0501  Arman Filter, NP 03/18/12 [phone]

## 2012-03-18 NOTE — ED Provider Notes (Signed)
Medical screening examination/treatment/procedure(s) were performed by non-physician practitioner and as supervising physician I was immediately available for consultation/collaboration.  Olivia Mackie, MD 03/18/12 [phone]

## 2012-03-18 NOTE — Telephone Encounter (Signed)
Called and spoke with pt and per SN---keep the appt on 6/24 and we will change metformin to 500mg  bid and the glimepiride to 4 mg every morning.  Called and spoke with pt and he is aware of new dosages of both meds and that these have been sent to his pharmacy.  Pt is aware to stay away from sweets and sugars and to increase his water intake.  Pt voiced his understanding of the new dose of both meds and SN recs.  Nothing further needed.

## 2012-03-18 NOTE — ED Provider Notes (Signed)
Medical screening examination/treatment/procedure(s) were performed by non-physician practitioner and as supervising physician I was immediately available for consultation/collaboration.  Olen Eaves M Gautham Hewins, MD 03/18/12 0555 

## 2012-03-18 NOTE — Telephone Encounter (Signed)
lmomtcb x1 for pt 

## 2012-03-18 NOTE — Telephone Encounter (Signed)
Pt return call. please cb at [phone]. Pt needs apt this week. He can't come in tomorrow morning.

## 2012-03-18 NOTE — ED Notes (Signed)
Patient discharge with written and verbal orders with steady gait. Respirations equal and unlabored. Skin warm and dry. No acute distress noted.

## 2012-03-18 NOTE — Discharge Instructions (Signed)
Diabetes and Exercise Regular exercise is important and can help:   Control blood glucose (sugar).   Decrease blood pressure.    Control blood lipids (cholesterol, triglycerides).   Improve overall health.  BENEFITS FROM EXERCISE  Improved fitness.   Improved flexibility.   Improved endurance.   Increased bone density.   Weight control.   Increased muscle strength.   Decreased body fat.   Improvement of the body's use of insulin, a hormone.   Increased insulin sensitivity.   Reduction of insulin needs.   Reduced stress and tension.   Helps you feel better.  People with diabetes who add exercise to their lifestyle gain additional benefits, including:  Weight loss.   Reduced appetite.   Improvement of the body's use of blood glucose.   Decreased risk factors for heart disease:   Lowering of cholesterol and triglycerides.   Raising the level of good cholesterol (high-density lipoproteins, HDL).   Lowering blood sugar.   Decreased blood pressure.  TYPE 1 DIABETES AND EXERCISE  Exercise will usually lower your blood glucose.   If blood glucose is greater than 240 mg/dl, check urine ketones. If ketones are present, do not exercise.   Location of the insulin injection sites may need to be adjusted with exercise. Avoid injecting insulin into areas of the body that will be exercised. For example, avoid injecting insulin into:   The arms when playing tennis.   The legs when jogging. For more information, discuss this with your caregiver.   Keep a record of:   Food intake.   Type and amount of exercise.   Expected peak times of insulin action.   Blood glucose levels.  Do this before, during, and after exercise. Review your records with your caregiver. This will help you to develop guidelines for adjusting food intake and insulin amounts.  TYPE 2 DIABETES AND EXERCISE  Regular physical activity can help control blood glucose.   Exercise is important  because it may:   Increase the body's sensitivity to insulin.   Improve blood glucose control.   Exercise reduces the risk of heart disease. It decreases serum cholesterol and triglycerides. It also lowers blood pressure.   Those who take insulin or oral hypoglycemic agents should watch for signs of hypoglycemia. These signs include dizziness, shaking, sweating, chills, and confusion.   Body water is lost during exercise. It must be replaced. This will help to avoid loss of body fluids (dehydration) or heat stroke.  Be sure to talk to your caregiver before starting an exercise program to make sure it is safe for you. Remember, any activity is better than none.  Document Released: 12/22/2003 Document Revised: 09/20/2011 Document Reviewed: 04/07/2009 [facility] Patient Information 2012 [place], [place]. I have added Metformin to your medications to get better control

## 2012-03-18 NOTE — Telephone Encounter (Signed)
I spoke with pt and he c/o increase BS. Last night it was 370. Pt was given metformin 950 mg to take daily. His BS is still running high. Per pt he needs an apt to SN only this week. Please advise Dr. Kriste Basque thanks

## 2012-03-19 ENCOUNTER — Ambulatory Visit: Payer: 59 | Admitting: Adult Health

## 2012-03-24 ENCOUNTER — Telehealth: Payer: Self-pay | Admitting: Pulmonary Disease

## 2012-03-24 NOTE — Telephone Encounter (Signed)
I spoke with pt and he was callignt o very the dosage of his metformin and glimepride. I advised pt of this and nothing further was needed

## 2012-04-02 ENCOUNTER — Telehealth: Payer: Self-pay | Admitting: Pulmonary Disease

## 2012-04-02 NOTE — Telephone Encounter (Signed)
Mailbox is full and unable to leave message-will need to call patient back tomorrow.

## 2012-04-03 MED ORDER — GLUCOSE BLOOD VI STRP: ORAL_STRIP | Status: DC

## 2012-04-03 MED ORDER — ATENOLOL 100 MG PO TABS: 100.0000 mg | ORAL_TABLET | Freq: Every day | ORAL | Status: DC

## 2012-04-03 NOTE — Telephone Encounter (Signed)
Spoke with pt. He states that he needs rx sent for atenolol and also for DM test strips. I advised will send in rxs, but needs to keep appt for future refills. Pt verbalized understanding.

## 2012-04-07 ENCOUNTER — Ambulatory Visit (INDEPENDENT_AMBULATORY_CARE_PROVIDER_SITE_OTHER)
Admission: RE | Admit: 2012-04-07 | Discharge: 2012-04-07 | Disposition: A | Discharge status: Home / Self Care | Payer: 59 | Source: Ambulatory Visit | Attending: Pulmonary Disease | Admitting: Pulmonary Disease

## 2012-04-07 ENCOUNTER — Ambulatory Visit (INDEPENDENT_AMBULATORY_CARE_PROVIDER_SITE_OTHER): Payer: 59 | Admitting: Pulmonary Disease

## 2012-04-07 ENCOUNTER — Encounter: Payer: Self-pay | Admitting: Pulmonary Disease

## 2012-04-07 ENCOUNTER — Other Ambulatory Visit (INDEPENDENT_AMBULATORY_CARE_PROVIDER_SITE_OTHER): Payer: 59

## 2012-04-07 VITALS — BP 132/88 | HR 60 | Temp 97.2°F | Ht >= 80 in | Wt 287.0 lb

## 2012-04-07 DIAGNOSIS — E785 Hyperlipidemia, unspecified: Secondary | ICD-10-CM

## 2012-04-07 DIAGNOSIS — E119 Type 2 diabetes mellitus without complications: Secondary | ICD-10-CM

## 2012-04-07 DIAGNOSIS — IMO0002 Reserved for concepts with insufficient information to code with codable children: Secondary | ICD-10-CM

## 2012-04-07 DIAGNOSIS — Z0000 Encounter for general adult medical examination without abnormal findings: Secondary | ICD-10-CM

## 2012-04-07 DIAGNOSIS — I10 Essential (primary) hypertension: Secondary | ICD-10-CM

## 2012-04-07 DIAGNOSIS — K219 Gastro-esophageal reflux disease without esophagitis: Secondary | ICD-10-CM

## 2012-04-07 DIAGNOSIS — E663 Overweight: Secondary | ICD-10-CM

## 2012-04-07 LAB — LIPID PANEL
Cholesterol: 165 mg/dL (ref 0–200)
HDL: 36 mg/dL — ABNORMAL LOW (ref >39.00)
VLDL: 15.4 mg/dL (ref 0.0–40.0)

## 2012-04-07 LAB — BASIC METABOLIC PANEL
BUN: 9 mg/dL (ref 6–23)
CO2: 29 mEq/L (ref 19–32)
GFR: 99.09 mL/min (ref >60.00)
Glucose, Bld: 122 mg/dL — ABNORMAL HIGH (ref 70–99)
Potassium: 4.1 mEq/L (ref 3.5–5.1)
Sodium: 141 mEq/L (ref 135–145)

## 2012-04-07 MED ORDER — MELOXICAM 15 MG PO TABS: 15.0000 mg | ORAL_TABLET | Freq: Every day | ORAL | Status: DC | PRN

## 2012-04-07 MED ORDER — METFORMIN HCL 500 MG PO TABS: 500.0000 mg | ORAL_TABLET | Freq: Two times a day (BID) | ORAL | Status: DC

## 2012-04-07 MED ORDER — GLIMEPIRIDE 4 MG PO TABS: 4.0000 mg | ORAL_TABLET | Freq: Every day | ORAL | Status: DC

## 2012-04-07 MED ORDER — METHOCARBAMOL 500 MG PO TABS: 500.0000 mg | ORAL_TABLET | Freq: Three times a day (TID) | ORAL | Status: AC | PRN

## 2012-04-07 NOTE — Patient Instructions (Addendum)
Today we updated your med list in our EPIC system...     For your diabetes:    You need to be on the world's BEST diabetic diet- no sugars, no sweets, no pastas, no bread/ biscuits, etc...    Take the METFORMIN 500mg  twice daily...    Take the GLIMEPIRIDE 4mg  each AM...    Drink plenty of water...    Check your sugars at home & call for questions...  For your back:    Rest your back- avoid heavy lifting for now...    Apply heat> heating pad, be-gay, icey-hot, etc...    Use the new MELOXICAM 15mg  /d as needed for pain...    And the muscle relaxer ROBAXIN 500mg  up to 3 times daily as needed...  Today we did Back films, CXR, EKG, & additional blood work...    We will call you w/ these reports...  Let's plan a follow up visit in 3-4 months.Marland KitchenMarland Kitchen

## 2012-04-09 ENCOUNTER — Telehealth: Payer: Self-pay | Admitting: Pulmonary Disease

## 2012-04-09 NOTE — Telephone Encounter (Signed)
Spoke with pt and notified of results of labs and xray. Pt verbalized understanding and states no questions. Nothing further needed.

## 2012-04-18 ENCOUNTER — Other Ambulatory Visit: Payer: Self-pay | Admitting: Pulmonary Disease
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
## 2012-05-05 ENCOUNTER — Telehealth: Payer: Self-pay | Admitting: Pulmonary Disease

## 2012-05-05 MED ORDER — ATENOLOL 100 MG PO TABS: 100.0000 mg | ORAL_TABLET | Freq: Every day | ORAL | Status: DC

## 2012-05-05 MED ORDER — SERTRALINE HCL 100 MG PO TABS: 100.0000 mg | ORAL_TABLET | Freq: Every day | ORAL | Status: DC

## 2012-05-05 NOTE — Telephone Encounter (Signed)
Pharmacy never received these 2 rx's. I resent these and pt aware. Nothing further was needed

## 2012-06-04 ENCOUNTER — Telehealth: Payer: Self-pay | Admitting: Pulmonary Disease

## 2012-06-04 MED ORDER — AMOXICILLIN-POT CLAVULANATE 875-125 MG PO TABS: 1.0000 | ORAL_TABLET | Freq: Two times a day (BID) | ORAL | Status: AC

## 2012-06-04 MED ORDER — HYDROCODONE-ACETAMINOPHEN 5-325 MG PO TABS: 1.0000 | ORAL_TABLET | Freq: Four times a day (QID) | ORAL | Status: DC | PRN

## 2012-06-04 NOTE — Telephone Encounter (Signed)
Per SN---needs to see a dentist ASAP!!!  He has mobic that he can use for the anti-inflammatory and call in augmentin 875 mg  #20  1 po bid  Until gone (this has been called to the pharmacy) and call in norco  #50  1 po every 6 hours as needed for pain( this has been called to the pharmacy.).  lmomtcb for the pt to make him aware.

## 2012-06-04 NOTE — Telephone Encounter (Signed)
Called and spoke with pt and he is aware of SN recs and nothing further is needed.

## 2012-06-04 NOTE — Telephone Encounter (Signed)
Spoke with pt. He states that he has abscessed tooth- c/o swelling in jaw and pain in the back of his mouth. Has had this before and knows that this is the issue. He plans to visit oral surgeon to have tooth extracted at some point. Wants to know if SN will call in prednisone and abx in the meantime. Please advise, thanks! Allergies  Allergen Reactions  . Metformin     REACTION: causes nausea

## 2012-06-24 ENCOUNTER — Telehealth: Payer: Self-pay | Admitting: Pulmonary Disease

## 2012-06-25 NOTE — Telephone Encounter (Signed)
Called and spoke with pts wife and she requested that we give these forms to [place].  Called and spoke with [place] and she will come down here to pick these up.

## 2012-07-08 ENCOUNTER — Ambulatory Visit: Payer: 59 | Admitting: Pulmonary Disease

## 2013-03-10 ENCOUNTER — Encounter: Payer: Self-pay | Admitting: Internal Medicine

## 2013-03-10 ENCOUNTER — Other Ambulatory Visit (INDEPENDENT_AMBULATORY_CARE_PROVIDER_SITE_OTHER): Payer: 59

## 2013-03-10 ENCOUNTER — Ambulatory Visit (INDEPENDENT_AMBULATORY_CARE_PROVIDER_SITE_OTHER): Payer: 59 | Admitting: Internal Medicine

## 2013-03-10 VITALS — BP 136/98 | HR 79 | Temp 98.3°F | Ht >= 80 in | Wt 287.4 lb

## 2013-03-10 DIAGNOSIS — R109 Unspecified abdominal pain: Secondary | ICD-10-CM | POA: Insufficient documentation

## 2013-03-10 DIAGNOSIS — F17200 Nicotine dependence, unspecified, uncomplicated: Secondary | ICD-10-CM

## 2013-03-10 DIAGNOSIS — J019 Acute sinusitis, unspecified: Secondary | ICD-10-CM

## 2013-03-10 DIAGNOSIS — I10 Essential (primary) hypertension: Secondary | ICD-10-CM

## 2013-03-10 DIAGNOSIS — E119 Type 2 diabetes mellitus without complications: Secondary | ICD-10-CM

## 2013-03-10 LAB — BASIC METABOLIC PANEL
BUN: 12 mg/dL (ref 6–23)
CO2: 25 mEq/L (ref 19–32)
Chloride: 102 mEq/L (ref 96–112)
Glucose, Bld: 181 mg/dL — ABNORMAL HIGH (ref 70–99)
Potassium: 3.9 mEq/L (ref 3.5–5.1)
Sodium: 135 mEq/L (ref 135–145)

## 2013-03-10 LAB — HEPATIC FUNCTION PANEL
ALT: 79 U/L — ABNORMAL HIGH (ref 0–53)
AST: 40 U/L — ABNORMAL HIGH (ref 0–37)
Albumin: 4.4 g/dL (ref 3.5–5.2)
Alkaline Phosphatase: 53 U/L (ref 39–117)
Bilirubin, Direct: 0.2 mg/dL (ref 0.0–0.3)
Total Protein: 8.1 g/dL (ref 6.0–8.3)

## 2013-03-10 LAB — CBC WITH DIFFERENTIAL/PLATELET
Basophils Relative: 0.8 % (ref 0.0–3.0)
Eosinophils Relative: 2.6 % (ref 0.0–5.0)
MCV: 85.4 fl (ref 78.0–100.0)
Monocytes Absolute: 0.5 10*3/uL (ref 0.1–1.0)
Monocytes Relative: 10.3 % (ref 3.0–12.0)
Neutrophils Relative %: 40.1 % — ABNORMAL LOW (ref 43.0–77.0)
RBC: 5.4 Mil/uL (ref 4.22–5.81)
WBC: 5.2 10*3/uL (ref 4.5–10.5)

## 2013-03-10 LAB — URINALYSIS, ROUTINE W REFLEX MICROSCOPIC
Hgb urine dipstick: NEGATIVE
Ketones, ur: NEGATIVE
Total Protein, Urine: 30
Urine Glucose: NEGATIVE
Urobilinogen, UA: 0.2 (ref 0.0–1.0)

## 2013-03-10 MED ORDER — AMOXICILLIN-POT CLAVULANATE 875-125 MG PO TABS: 1.0000 | ORAL_TABLET | Freq: Two times a day (BID) | ORAL | Status: DC
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
## 2013-03-10 NOTE — Patient Instructions (Addendum)
For headache and stuffy nose > advil cold and sinus  For cough and congestion> mucinex dm up to 1200 mg every 12 hours  Drink plenty of fluids that do not contain sugar.  Augmentin 875 mg twice daily with glass of water x 10 days  The key is to stop smoking completely before smoking completely stops you!   Please remember to go to the lab   department downstairs for your tests - we will call you with the results when they are available.

## 2013-03-10 NOTE — Assessment & Plan Note (Addendum)
Augmentin 875 twice daily x 10d plus ok to use advil cold and sinus short term only - advised to quit smoking now (discussed separately)

## 2013-03-11 DIAGNOSIS — F17200 Nicotine dependence, unspecified, uncomplicated: Secondary | ICD-10-CM | POA: Insufficient documentation

## 2013-03-11 NOTE — Assessment & Plan Note (Signed)
Not Adequate control on present rx, reviewed  - ? Taking atenolol correctly with pulse 80 at ov > over using otcs, asked him to just use advil cold and sinus and only in short run and quit mixing and matching otcs that may have duplicate /additive effects on bp

## 2013-03-11 NOTE — Assessment & Plan Note (Signed)
Discussed the risks and costs (both direct and indirect)  of smoking relative to the benefits of quitting but patient unwilling to commit at this point to a specific quit date > defer to Dr Kriste Basque

## 2013-03-11 NOTE — Assessment & Plan Note (Signed)
hgba1c actually trending down though sugars still too high > rec plenty of non glucose containing liquids and f/u with Dr Kriste Basque to regroup on longterm rx

## 2013-03-20 ENCOUNTER — Telehealth: Payer: Self-pay | Admitting: Pulmonary Disease

## 2013-03-20 NOTE — Telephone Encounter (Signed)
Per SN--  03/10/13 OV with MW--  bs 181 a1c 8.9 and this was improved .    Is he taking the metformin 1 po bid? And the glimepiride 4 mg every morning? Will need to keep the appt with SN on 6/13 and we will discuss his meds at this OV.    lmomtcb

## 2013-03-20 NOTE — Telephone Encounter (Signed)
Spoke with patient,  Patient states he feels sluggish and tired this morning Reports blood sugar was 221 this morning without eating and have been running in the 200's since his last OV Patient says he being compliant with medications Concerned that metformin is not working-- taking Metformin 500mg  BID w meals  Patient last seen 03/10/13 acute visit w Dr. Sherene Sires: lab work also done Patient Instructions    For headache and stuffy nose > advil cold and sinus  For cough and congestion> mucinex dm up to 1200 mg every 12 hours  Drink plenty of fluids that do not contain sugar.  Augmentin 875 mg twice daily with glass of water x 10 days  The key is to stop smoking completely before smoking completely stops you!  Please remember to go to the lab department downstairs for your tests - we will call you with the results when they are available   Next OV 03/27/13 w Dr. Kriste Basque  Patient states he will come in if he needs to Dr. Kriste Basque please advise, thank you

## 2013-03-23 NOTE — Telephone Encounter (Signed)
Pt advised. He states he is taking metformin twice daily and glimeperide each morning. He states that his glucose is till in the 200's. I advised him to continue meds and keep appt this Friday. Carron Curie, CMA

## 2013-03-27 ENCOUNTER — Encounter: Payer: Self-pay | Admitting: Pulmonary Disease

## 2013-03-27 ENCOUNTER — Ambulatory Visit (INDEPENDENT_AMBULATORY_CARE_PROVIDER_SITE_OTHER): Payer: 59 | Admitting: Pulmonary Disease

## 2013-03-27 DIAGNOSIS — E785 Hyperlipidemia, unspecified: Secondary | ICD-10-CM

## 2013-03-27 DIAGNOSIS — E663 Overweight: Secondary | ICD-10-CM

## 2013-03-27 DIAGNOSIS — F411 Generalized anxiety disorder: Secondary | ICD-10-CM

## 2013-03-27 DIAGNOSIS — R748 Abnormal levels of other serum enzymes: Secondary | ICD-10-CM

## 2013-03-27 DIAGNOSIS — E119 Type 2 diabetes mellitus without complications: Secondary | ICD-10-CM

## 2013-03-27 DIAGNOSIS — I10 Essential (primary) hypertension: Secondary | ICD-10-CM

## 2013-03-27 DIAGNOSIS — K219 Gastro-esophageal reflux disease without esophagitis: Secondary | ICD-10-CM

## 2013-03-27 MED ORDER — LOSARTAN POTASSIUM 100 MG PO TABS: 100.0000 mg | ORAL_TABLET | Freq: Every day | ORAL | Status: DC

## 2013-03-27 MED ORDER — GLUCOSE BLOOD VI STRP: ORAL_STRIP | Status: DC

## 2013-03-27 MED ORDER — ALOGLIPTIN-PIOGLITAZONE 25-15 MG PO TABS: 1.0000 | ORAL_TABLET | Freq: Every day | ORAL | Status: DC
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
## 2013-03-27 NOTE — Patient Instructions (Addendum)
Today we updated your med list in our EPIC system...    Continue your current medications the same...  We decided to STOP the Metformin due to the diarrhea side effect... We have recommended a replacement oral diabetic pill called [name] 25/15- one tab daily... Continue the Glimepiride 4mg  one tab each AM...  For your BP & to prevent diabetic damage to your system>    We are adding LOSARTAN 100mg  - one tab daily...     Continue the Atenolol 100mg - one tab daily...  Remember that DIET & Exercise are the keys to success> you MUST get the weight down...    Eliminate carbs and fat from your diet...    Look up THE GLYCEMIC INDEX on your computer to help w/ food choices  Call for any questions...  Let's plan a follow up visit in 29mo, sooner if needed for problems...  ...

## 2013-04-08 ENCOUNTER — Telehealth: Payer: Self-pay | Admitting: Pulmonary Disease

## 2013-04-08 MED ORDER — GLIMEPIRIDE 4 MG PO TABS: 4.0000 mg | ORAL_TABLET | Freq: Every day | ORAL | Status: DC

## 2013-04-08 NOTE — Telephone Encounter (Signed)
Rx refill was sent to pharm  Spouse notified and notified that this was done Nothing further needed

## 2013-04-23 ENCOUNTER — Telehealth: Payer: Self-pay | Admitting: Pulmonary Disease

## 2013-04-23 ENCOUNTER — Other Ambulatory Visit: Payer: Self-pay | Admitting: Pulmonary Disease

## 2013-04-23 NOTE — Telephone Encounter (Signed)
LMTCB x 1- Refills were sent to [facility] on [address] on 03-27-13 per EMR.

## 2013-04-24 MED ORDER — SERTRALINE HCL 100 MG PO TABS: 100.0000 mg | ORAL_TABLET | Freq: Every day | ORAL | Status: DC

## 2013-04-24 NOTE — Telephone Encounter (Signed)
Spoke with patient made him aware Rx for alogliptin-Pioglitazone has been sent in to preferred pharm Patient also requesting Rx for Zoloft, this has also been sent. Pt aware Nothing further needed at this time

## 2013-05-19 ENCOUNTER — Other Ambulatory Visit: Payer: Self-pay | Admitting: Pulmonary Disease

## 2013-08-04 ENCOUNTER — Telehealth: Payer: Self-pay | Admitting: *Deleted

## 2013-08-04 DIAGNOSIS — E119 Type 2 diabetes mellitus without complications: Secondary | ICD-10-CM

## 2013-08-04 NOTE — Telephone Encounter (Signed)
Per SN---  Sorry to hear about the insurance situation.  The only hope is :  1.  Diet---get weight down  2.  Refer to endocrinology for their review.  The only generics for the DM meds are  Metformin and he is intolerant  Glimepiride and he is already on this.    If he wants to try [facility] to get his medications---for a 90 day supply of the glimepiride for $20 or a 180 day supply for $37 with free shipping.  i have called and lmomtcb to discuss this with the pt.

## 2013-08-04 NOTE — Telephone Encounter (Signed)
Scott Thomas came down to the office today and she stated that Klein told her that his wife got laid off from her job and now they have no insurance.  He said he went to go and pick up his DM meds---oseni and this was almost $300 without his insurance.  SN is there any other alternative to this medication?  Please advise. Thanks  Allergies  Allergen Reactions  . Metformin     REACTION: causes nausea

## 2013-08-05 NOTE — Telephone Encounter (Signed)
Pt's wife Irene Shipper) returned call and can be reached @ [phone]. Leanora Ivanoff

## 2013-08-05 NOTE — Telephone Encounter (Signed)
LM for Irene Shipper (Pt's wife)  to call back

## 2013-08-05 NOTE — Telephone Encounter (Signed)
Pt's spouse returning call can be reached at [phone].Scott Thomas

## 2013-08-05 NOTE — Telephone Encounter (Signed)
Spouse is aware of recs. I have sent in referral to endocrinologists per her request. Nothing further needed

## 2013-08-05 NOTE — Telephone Encounter (Signed)
Pt spouse called back. When transferred no one would answer after repeating "hello" several times. I called the phone # back and had to [facility] x1

## 2013-09-23 ENCOUNTER — Telehealth: Payer: Self-pay | Admitting: Pulmonary Disease

## 2013-09-23 NOTE — Telephone Encounter (Signed)
Called spoke with patient who c/o hyperglycemia at 300 x3 days.  Pt asymptomatic except for lethargy.  Pt without insurance since his spouse lost her job and the [employer] $200 out of pocket.  Pt stated he has been taking his Amaryl 4mg  BID to try to offset the hyperglycemia but this is not working.  Appt scheduled with TP for tomorrow 12.11.14 @ 1015.  Pt aware to call or seek emergency care if he becomes symptomatic before appt tomorrow.  Will sign off.

## 2013-09-24 ENCOUNTER — Other Ambulatory Visit (INDEPENDENT_AMBULATORY_CARE_PROVIDER_SITE_OTHER): Payer: Self-pay

## 2013-09-24 ENCOUNTER — Encounter: Payer: Self-pay | Admitting: Adult Health

## 2013-09-24 ENCOUNTER — Ambulatory Visit (INDEPENDENT_AMBULATORY_CARE_PROVIDER_SITE_OTHER): Payer: 59 | Admitting: Adult Health

## 2013-09-24 VITALS — BP 126/82 | HR 75 | Temp 97.5°F | Ht >= 80 in | Wt 291.0 lb

## 2013-09-24 DIAGNOSIS — I10 Essential (primary) hypertension: Secondary | ICD-10-CM

## 2013-09-24 DIAGNOSIS — E119 Type 2 diabetes mellitus without complications: Secondary | ICD-10-CM

## 2013-09-24 DIAGNOSIS — R7309 Other abnormal glucose: Secondary | ICD-10-CM

## 2013-09-24 DIAGNOSIS — R739 Hyperglycemia, unspecified: Secondary | ICD-10-CM

## 2013-09-24 LAB — URINALYSIS, ROUTINE W REFLEX MICROSCOPIC
Bilirubin Urine: NEGATIVE
Hgb urine dipstick: NEGATIVE
Ketones, ur: NEGATIVE
Urine Glucose: 100
Urobilinogen, UA: 1 (ref 0.0–1.0)

## 2013-09-24 LAB — BASIC METABOLIC PANEL
CO2: 29 mEq/L (ref 19–32)
Calcium: 9.1 mg/dL (ref 8.4–10.5)
Creatinine, Ser: 1 mg/dL (ref 0.4–1.5)
GFR: 109.48 mL/min (ref >60.00)
Glucose, Bld: 229 mg/dL — ABNORMAL HIGH (ref 70–99)
Sodium: 136 mEq/L (ref 135–145)

## 2013-09-24 LAB — HEMOGLOBIN A1C: Hgb A1c MFr Bld: 9.6 % — ABNORMAL HIGH (ref 4.6–6.5)

## 2013-09-24 MED ORDER — ALPRAZOLAM 0.25 MG PO TABS: 0.2500 mg | ORAL_TABLET | Freq: Every day | ORAL | Status: DC | PRN

## 2013-09-24 NOTE — Patient Instructions (Signed)
We are going to look into patient assistance for [name] .  For now continue on Amaryl 4mg  daily  Begin Januvia 100mg  daily .  Work on low sweet diet.  Please contact office for sooner follow up if symptoms do not improve or worsen or seek emergency care  follow up Dr. Kriste Basque  In 4 weeks and As needed
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
## 2013-09-29 NOTE — Assessment & Plan Note (Signed)
Controlled on current regimen Encouraged on smoking cessation

## 2013-09-29 NOTE — Assessment & Plan Note (Signed)
Uncontrolled DM  Finances are an issue.  Advised on affordable care insurance and medicaid  Pt assistance paperwork for oseni done  Labs today .   Plan  We are going to look into patient assistance for [name] .  For now continue on Amaryl 4mg  daily  Begin Januvia 100mg  daily .  Work on low sweet diet.  Please contact office for sooner follow up if symptoms do not improve or worsen or seek emergency care  follow up Dr. Kriste Basque  In 4 weeks and As needed

## 2013-10-06 ENCOUNTER — Telehealth: Payer: Self-pay | Admitting: Adult Health

## 2013-10-06 NOTE — Telephone Encounter (Signed)
Pt's mother dropped off Patient Assistance forms for [name] that were initiated for pt at 12.11.14 ov w/ TP However, pt did not provide any proof of income -- this will be returned to the patient/office without this Have tried to call pt's mother numerous times, keep getting her voicemail LMOM TCB x1 for pt Will hold in my inbasket

## 2013-10-13 NOTE — Telephone Encounter (Signed)
I called and spoke with pt. He will get this together and bring by our office. Nothing further needed

## 2013-10-22 ENCOUNTER — Encounter: Payer: Self-pay | Admitting: Adult Health

## 2013-10-22 ENCOUNTER — Ambulatory Visit (INDEPENDENT_AMBULATORY_CARE_PROVIDER_SITE_OTHER): Payer: Self-pay | Admitting: Adult Health

## 2013-10-22 VITALS — BP 108/64 | HR 69 | Temp 97.2°F | Ht >= 80 in | Wt 290.4 lb

## 2013-10-22 DIAGNOSIS — E119 Type 2 diabetes mellitus without complications: Secondary | ICD-10-CM

## 2013-10-22 NOTE — Assessment & Plan Note (Addendum)
Diabetes mellitus, uncontrolled. Patient education on diet, weight loss. Referral to endocrinology recommended.- patient declines at this time  He is currently looking into private insurance  [facility]   Plan  Restart [name] 25/30mg  daily  Stop Januvia  We are going to look into patient assistance for [name] .  For now continue on Amaryl 4mg  daily  Work on low sweet diet.  Call us back if you change your mind on referall to Endocrinology  Please contact office for sooner follow up if symptoms do not improve or worsen or seek emergency care  Follow up Dr. Kriste BasqueNadel  In 8 weeks and As needed
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
## 2013-10-22 NOTE — Patient Instructions (Signed)
Restart [name] 25/30mg  daily  Stop Januvia  We are going to look into patient assistance for [name] .  For now continue on Amaryl 4mg  daily  Work on low sweet diet.  Call us back if you change your mind on referall to Endocrinology  Please contact office for sooner follow up if symptoms do not improve or worsen or seek emergency care  Follow up Dr. Kriste BasqueNadel  In 8 weeks and As needed

## 2013-11-05 ENCOUNTER — Telehealth: Payer: Self-pay | Admitting: Adult Health

## 2013-11-05 NOTE — Telephone Encounter (Signed)
[facility] [name] Pt's SSN was missing from the form Letter requested that I call [phone]  Called the number above, spoke with rep Rosey Batheresa The application does have pt's SSN on it, is it referring to the first application? [facility] and forms placed in scan folder  Will sign off

## 2013-11-23 ENCOUNTER — Telehealth: Payer: Self-pay | Admitting: Adult Health

## 2013-11-23 NOTE — Telephone Encounter (Signed)
Samples are up front for pick up. Pt is aware. 

## 2013-12-25 ENCOUNTER — Telehealth: Payer: Self-pay | Admitting: Pulmonary Disease

## 2013-12-25 NOTE — Telephone Encounter (Signed)
I have called and spoke with Gunnar FusiPaula at [facility]. She states that the case is pending. They need the pt's SSN. I have contacted the pt's wife and obtained his SSN. The completed form will be updated with the pt's SSN and be refaxed. Samples will be left up front for pick up.

## 2013-12-28 ENCOUNTER — Ambulatory Visit: Payer: Self-pay | Admitting: Pulmonary Disease

## 2014-01-19 ENCOUNTER — Telehealth: Payer: Self-pay | Admitting: Pulmonary Disease

## 2014-01-19 MED ORDER — LOSARTAN POTASSIUM 100 MG PO TABS: 100.0000 mg | ORAL_TABLET | Freq: Every day | ORAL | Status: DC

## 2014-01-19 MED ORDER — SERTRALINE HCL 100 MG PO TABS: 100.0000 mg | ORAL_TABLET | Freq: Every day | ORAL | Status: DC

## 2014-01-19 NOTE — Telephone Encounter (Signed)
Wife reports pt still looking for PCP that accepting new pt's. They are going to try downstairs.  RX has been sent.  Nothing further needed

## 2014-01-29 ENCOUNTER — Telehealth: Payer: Self-pay | Admitting: Pulmonary Disease

## 2014-01-29 MED ORDER — ALPRAZOLAM 0.25 MG PO TABS: 0.2500 mg | ORAL_TABLET | Freq: Every day | ORAL | Status: DC | PRN

## 2014-01-29 NOTE — Telephone Encounter (Signed)
Spoke with the pt's spouse  She states pt needs refill on his xanax  Was informed about SN retiring from [facility] on 01/19/14 and he has not found new PCP yet  I gave her the number to the [facility] to call and make him appt  Pt last seen by SN ON 03/27/13 and was seen by TP on 10/22/13 Last xanax ordered 09/24/13 # 30 no refills  Please advise if okay to refill thanks!

## 2014-01-29 NOTE — Telephone Encounter (Signed)
Ok to give the refill of the xanax.  With 1 additional refill. thanks

## 2014-01-29 NOTE — Telephone Encounter (Signed)
Rx was called to pharm with 1 additional rf  Spoke with the pt's spouse and made aware  Nothing further needed

## 2014-02-15 ENCOUNTER — Other Ambulatory Visit: Payer: Self-pay | Admitting: Pulmonary Disease

## 2014-02-17 ENCOUNTER — Telehealth: Payer: Self-pay | Admitting: Pulmonary Disease

## 2014-02-17 MED ORDER — ATENOLOL 100 MG PO TABS: ORAL_TABLET | ORAL | Status: DC

## 2014-02-17 NOTE — Telephone Encounter (Signed)
Spoke with pt/s wife.  Pt is still working on finding a new PCP but is out of Atenolol at this time.  Refills sent in until pt can fine new PCP.

## 2014-03-18 ENCOUNTER — Other Ambulatory Visit: Payer: Self-pay | Admitting: Pulmonary Disease

## 2014-03-18 NOTE — Telephone Encounter (Signed)
Pt wife calling for refills on the following meds losartan and sertaline called to [facility] on [address].[name]

## 2014-04-21 ENCOUNTER — Telehealth: Payer: Self-pay | Admitting: Pulmonary Disease

## 2014-04-21 ENCOUNTER — Other Ambulatory Visit: Payer: Self-pay | Admitting: Pulmonary Disease

## 2014-04-21 NOTE — Telephone Encounter (Signed)
Return call cn b reached @ [phone].[name]

## 2014-04-21 NOTE — Telephone Encounter (Signed)
lmomtcb x1 Has pt found a new PCP?

## 2014-04-22 MED ORDER — SERTRALINE HCL 100 MG PO TABS: ORAL_TABLET | ORAL | Status: DC

## 2014-04-22 MED ORDER — LOSARTAN POTASSIUM 100 MG PO TABS: ORAL_TABLET | ORAL | Status: DC

## 2014-04-22 MED ORDER — GLIMEPIRIDE 4 MG PO TABS: 4.0000 mg | ORAL_TABLET | Freq: Every day | ORAL | Status: DC

## 2014-04-22 NOTE — Telephone Encounter (Signed)
Called and spoke with pts wife and she is aware of appt scheduled for the pt for tomorrow at 2811.  Refills have been sent to the pharmacy for the pt.

## 2014-04-23 ENCOUNTER — Other Ambulatory Visit (INDEPENDENT_AMBULATORY_CARE_PROVIDER_SITE_OTHER): Payer: Self-pay

## 2014-04-23 ENCOUNTER — Ambulatory Visit (INDEPENDENT_AMBULATORY_CARE_PROVIDER_SITE_OTHER): Payer: Self-pay | Admitting: Pulmonary Disease

## 2014-04-23 ENCOUNTER — Encounter: Payer: Self-pay | Admitting: Pulmonary Disease

## 2014-04-23 VITALS — BP 146/92 | HR 68 | Temp 98.7°F | Ht >= 80 in | Wt 304.4 lb

## 2014-04-23 DIAGNOSIS — E785 Hyperlipidemia, unspecified: Secondary | ICD-10-CM

## 2014-04-23 DIAGNOSIS — K219 Gastro-esophageal reflux disease without esophagitis: Secondary | ICD-10-CM

## 2014-04-23 DIAGNOSIS — I10 Essential (primary) hypertension: Secondary | ICD-10-CM

## 2014-04-23 DIAGNOSIS — E663 Overweight: Secondary | ICD-10-CM

## 2014-04-23 DIAGNOSIS — E119 Type 2 diabetes mellitus without complications: Secondary | ICD-10-CM

## 2014-04-23 DIAGNOSIS — F411 Generalized anxiety disorder: Secondary | ICD-10-CM

## 2014-04-23 DIAGNOSIS — R748 Abnormal levels of other serum enzymes: Secondary | ICD-10-CM

## 2014-04-23 LAB — BASIC METABOLIC PANEL
BUN: 9 mg/dL (ref 6–23)
CO2: 28 meq/L (ref 19–32)
Calcium: 9.6 mg/dL (ref 8.4–10.5)
Chloride: 104 mEq/L (ref 96–112)
Creatinine, Ser: 1 mg/dL (ref 0.4–1.5)
GFR: 107.86 mL/min (ref >60.00)
GLUCOSE: 146 mg/dL — AB (ref 70–99)
Potassium: 4.5 mEq/L (ref 3.5–5.1)
Sodium: 138 mEq/L (ref 135–145)

## 2014-04-23 LAB — CBC WITH DIFFERENTIAL/PLATELET
Basophils Absolute: 0 10*3/uL (ref 0.0–0.1)
Basophils Relative: 0.5 % (ref 0.0–3.0)
EOS PCT: 1.9 % (ref 0.0–5.0)
Eosinophils Absolute: 0.1 10*3/uL (ref 0.0–0.7)
HEMATOCRIT: 45.3 % (ref 39.0–52.0)
Hemoglobin: 15 g/dL (ref 13.0–17.0)
LYMPHS ABS: 1.7 10*3/uL (ref 0.7–4.0)
Lymphocytes Relative: 40.2 % (ref 12.0–46.0)
MCHC: 33.1 g/dL (ref 30.0–36.0)
MCV: 87.3 fl (ref 78.0–100.0)
Monocytes Absolute: 0.4 10*3/uL (ref 0.1–1.0)
Monocytes Relative: 8.8 % (ref 3.0–12.0)
NEUTROS PCT: 48.6 % (ref 43.0–77.0)
Neutro Abs: 2.1 10*3/uL (ref 1.4–7.7)
Platelets: 226 10*3/uL (ref 150.0–400.0)
RBC: 5.19 Mil/uL (ref 4.22–5.81)
RDW: 14.7 % (ref 11.5–15.5)
WBC: 4.3 10*3/uL (ref 4.0–10.5)

## 2014-04-23 LAB — LIPID PANEL
CHOLESTEROL: 182 mg/dL (ref 0–200)
HDL: 39.6 mg/dL (ref >39.00)
LDL CALC: 133 mg/dL — AB (ref 0–99)
NonHDL: 142.4
Total CHOL/HDL Ratio: 5
Triglycerides: 48 mg/dL (ref 0.0–149.0)
VLDL: 9.6 mg/dL (ref 0.0–40.0)

## 2014-04-23 LAB — HEPATIC FUNCTION PANEL
ALBUMIN: 4.3 g/dL (ref 3.5–5.2)
ALT: 27 U/L (ref 0–53)
AST: 16 U/L (ref 0–37)
Alkaline Phosphatase: 41 U/L (ref 39–117)
Bilirubin, Direct: 0.2 mg/dL (ref 0.0–0.3)
Total Bilirubin: 1.3 mg/dL — ABNORMAL HIGH (ref 0.2–1.2)
Total Protein: 7.4 g/dL (ref 6.0–8.3)

## 2014-04-23 LAB — TSH: TSH: 0.68 u[IU]/mL (ref 0.35–4.50)

## 2014-04-23 LAB — HEMOGLOBIN A1C: Hgb A1c MFr Bld: 7.2 % — ABNORMAL HIGH (ref 4.6–6.5)

## 2014-04-23 MED ORDER — GLIMEPIRIDE 4 MG PO TABS: 4.0000 mg | ORAL_TABLET | Freq: Every day | ORAL | Status: DC

## 2014-04-23 MED ORDER — ATENOLOL 100 MG PO TABS: ORAL_TABLET | ORAL | Status: DC

## 2014-04-23 MED ORDER — OMEPRAZOLE 20 MG PO CPDR: DELAYED_RELEASE_CAPSULE | ORAL | Status: DC

## 2014-04-23 MED ORDER — LOSARTAN POTASSIUM 100 MG PO TABS: ORAL_TABLET | ORAL | Status: DC

## 2014-04-23 NOTE — Patient Instructions (Signed)
Today we updated your med list in our EPIC system...    Continue your current medications the same...  Today we did your follow up (fasting) blood work...    We will contact you w/ the results when available...   We reviewed your diet & remember to lok up the "glycemic index" to help with food choices...    Avoid foods w/ a high glycemic index...  Work on weight reduction as we discussed...  Call for any questions...  Let's plan a follow up visit in 36mo, sooner if needed for problems.Marland Kitchen..Marland Kitchen

## 2014-04-24 ENCOUNTER — Encounter: Payer: Self-pay | Admitting: Pulmonary Disease
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
## 2014-05-24 ENCOUNTER — Other Ambulatory Visit: Payer: Self-pay | Admitting: Pulmonary Disease

## 2014-05-24 ENCOUNTER — Telehealth: Payer: Self-pay | Admitting: Pulmonary Disease

## 2014-05-24 MED ORDER — SERTRALINE HCL 100 MG PO TABS: ORAL_TABLET | ORAL | Status: DC

## 2014-05-24 MED ORDER — LOSARTAN POTASSIUM 100 MG PO TABS: ORAL_TABLET | ORAL | Status: DC

## 2014-05-24 MED ORDER — ALOGLIPTIN-PIOGLITAZONE 25-30 MG PO TABS: 1.0000 | ORAL_TABLET | Freq: Every day | ORAL | Status: DC

## 2014-05-24 NOTE — Telephone Encounter (Signed)
Called spoke with spouse.  She reports pt only has 1 tablet left of Alogliptin-Pioglitazone 25-30 MG. She reports he gets patient assistance with this medication and we always send a 90 day RX for pt since it is expensive. Also wants to know if we have any samples in the meantime.  This needs to be faxed to [phone]. We did not have any samples at this time. Spouse aware. Nothing further needed

## 2014-05-26 ENCOUNTER — Telehealth: Payer: Self-pay | Admitting: Pulmonary Disease

## 2014-05-26 MED ORDER — ALOGLIPTIN-PIOGLITAZONE 25-30 MG PO TABS: 1.0000 | ORAL_TABLET | Freq: Every day | ORAL | Status: DC

## 2014-05-26 NOTE — Telephone Encounter (Signed)
Called spoke with spouse. Aware samples will be left for pick up. Nothing further needed

## 2014-06-24 ENCOUNTER — Other Ambulatory Visit: Payer: Self-pay | Admitting: Pulmonary Disease

## 2014-07-24 ENCOUNTER — Other Ambulatory Visit: Payer: Self-pay | Admitting: Pulmonary Disease

## 2014-08-25 ENCOUNTER — Telehealth: Payer: Self-pay | Admitting: Pulmonary Disease

## 2014-08-25 ENCOUNTER — Other Ambulatory Visit: Payer: Self-pay | Admitting: Pulmonary Disease

## 2014-08-25 MED ORDER — SERTRALINE HCL 100 MG PO TABS: ORAL_TABLET | ORAL | Status: DC

## 2014-08-25 NOTE — Telephone Encounter (Signed)
Called and spoke with pts wife and she is aware of the refills that have been added to the pts medication.  Nothing further is needed.

## 2014-12-08 ENCOUNTER — Other Ambulatory Visit: Payer: Self-pay | Admitting: Pulmonary Disease

## 2014-12-16 ENCOUNTER — Telehealth: Payer: Self-pay | Admitting: Pulmonary Disease

## 2014-12-16 MED ORDER — ALOGLIPTIN-PIOGLITAZONE 25-30 MG PO TABS: 1.0000 | ORAL_TABLET | Freq: Every day | ORAL | Status: DC

## 2014-12-16 MED ORDER — LOSARTAN POTASSIUM 100 MG PO TABS: 100.0000 mg | ORAL_TABLET | Freq: Every day | ORAL | Status: DC

## 2014-12-16 NOTE — Telephone Encounter (Signed)
Rx for oseni and losartan refilled per spouse request  I advised needs ov for more refills  He will call for appt

## 2015-01-19 ENCOUNTER — Other Ambulatory Visit: Payer: Self-pay | Admitting: Pulmonary Disease

## 2015-01-20 ENCOUNTER — Other Ambulatory Visit (INDEPENDENT_AMBULATORY_CARE_PROVIDER_SITE_OTHER): Payer: Self-pay

## 2015-01-20 ENCOUNTER — Ambulatory Visit (INDEPENDENT_AMBULATORY_CARE_PROVIDER_SITE_OTHER): Payer: Self-pay | Admitting: Pulmonary Disease

## 2015-01-20 DIAGNOSIS — E7800 Pure hypercholesterolemia, unspecified: Secondary | ICD-10-CM

## 2015-01-20 DIAGNOSIS — J449 Chronic obstructive pulmonary disease, unspecified: Secondary | ICD-10-CM

## 2015-01-20 DIAGNOSIS — E663 Overweight: Secondary | ICD-10-CM

## 2015-01-20 DIAGNOSIS — I10 Essential (primary) hypertension: Secondary | ICD-10-CM

## 2015-01-20 DIAGNOSIS — E119 Type 2 diabetes mellitus without complications: Secondary | ICD-10-CM

## 2015-01-20 LAB — BASIC METABOLIC PANEL
BUN: 8 mg/dL (ref 6–23)
CO2: 30 mEq/L (ref 19–32)
CREATININE: 0.99 mg/dL (ref 0.40–1.50)
Calcium: 9.5 mg/dL (ref 8.4–10.5)
Chloride: 105 mEq/L (ref 96–112)
GFR: 111.14 mL/min (ref >60.00)
Glucose, Bld: 117 mg/dL — ABNORMAL HIGH (ref 70–99)
Potassium: 4.4 mEq/L (ref 3.5–5.1)
SODIUM: 139 meq/L (ref 135–145)

## 2015-01-20 LAB — HEMOGLOBIN A1C: Hgb A1c MFr Bld: 7.3 % — ABNORMAL HIGH (ref 4.6–6.5)

## 2015-01-20 MED ORDER — ALOGLIPTIN-PIOGLITAZONE 25-30 MG PO TABS: 1.0000 | ORAL_TABLET | Freq: Every day | ORAL | Status: DC

## 2015-01-20 NOTE — Patient Instructions (Signed)
Today we updated your med list in our EPIC system...    Continue your current medications the same...  Today we rechecked your metabolic panel & [id number] test...    We will contact you w/ the results when available...   Let's get on track w/ our diet, exercise & WEIGHT REDUCTION!!!  Call for any questions...  Let's plan a follow up visit in 16mo, sooner if needed for problems.Marland Kitchen..Marland Kitchen

## 2015-01-23 ENCOUNTER — Other Ambulatory Visit: Payer: Self-pay | Admitting: Pulmonary Disease

## 2015-01-23 ENCOUNTER — Encounter: Payer: Self-pay | Admitting: Pulmonary Disease
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
## 2015-01-26 ENCOUNTER — Telehealth: Payer: Self-pay | Admitting: Pulmonary Disease

## 2015-01-26 NOTE — Telephone Encounter (Signed)
Signed [facility] Regarding pt's blood sugars, per SN: we don't get any samples of [name] or the separate ingredients.  He'll have to do without till it comes from the company.  Called spoke with pt's mother, forms sent to her to give to pt Called spoke with patient to discuss the above.  Asked to please return the forms ASAP so that we may work on getting him his medications.  Pt voiced his understanding and will call the office is his BS continue to rise.  Nothing further needed at this time Will forward to my inbox to follow the forms

## 2015-01-26 NOTE — Telephone Encounter (Signed)
Received word from pt's mother Scott Thomas that pt's Patient Assistance for the Scott LohOseni has expired This was done previously under TP - at that time pt had no insurance  Called spoke with patient and verified that he still has no insurance Patient Assistance initiated for the [place]seni.  Pt requests the forms be given to his mother to pass along to him - he will return once forms are complete so that we may fax.  Pt has now been out of his Scott Thomas for over 1 week and his fasting glucose this morning was over 200.  He is asking SN's recs to control his BS in the meantime. Message printed with patient assistance form for SN to sign.

## 2015-02-03 ENCOUNTER — Telehealth: Payer: Self-pay | Admitting: Pulmonary Disease

## 2015-02-03 NOTE — Telephone Encounter (Signed)
Pt's wife is calling to check status of PA for [name]. [phone]

## 2015-02-03 NOTE — Telephone Encounter (Signed)
Pt states that the patient is now covered under insurance and no longer needs the Patient Assistance.  Pt states that we need to initiate a PA for the medication.  Aware that we do not have any samples of this medication.  ------------ Prince Sol[place]alled [employer] insurance [phone] to initiate PA for [name] 25-30mg  [facility]. [employer] was able to give correct ID# Member ID# [id number] Transferred to [facility] PA dept [phone] Dx DM Type 2 (ICD-10 E11.9) [name] start date 03/27/13 Meds tried/failed- Metformin 500mg  BID and Januvia 100mg  QD Placed on a 15 minute hold - called pharmacy to have them fax over PA form with all the correct insurance information on it so that this request can be started through [email or web address] Will hold in triage to follow up - PA info is being faxed to triage

## 2015-02-03 NOTE — Telephone Encounter (Signed)
Pt wife calling stating that the drugstore will need a PA before the will fill the oseni, wife can be reached @ [phone].Caren GriffinsStanley A Dalton

## 2015-02-03 NOTE — Telephone Encounter (Signed)
Error msg already open

## 2015-02-03 NOTE — Telephone Encounter (Signed)
Have we received anything back on this pt's forms for [name]. Thanks!

## 2015-02-03 NOTE — Telephone Encounter (Signed)
I have not received anything on pt's [name] as of yet Did call pt to check on the status of his forms - pt stated these were to have been sent back to me and will check with his mother and spouse

## 2015-02-04 NOTE — Telephone Encounter (Signed)
Pt's wife calling to see if prior [employer] form was received - [phone]

## 2015-02-04 NOTE — Telephone Encounter (Signed)
Spoke with patient wife, aware that We have not received any PA information as of yet and we will call and request this again from pharmacy. Pt wife states that the patient has been without this medication for 1 week and his BS is starting to reflect this.  Advised that I would speak with SN to see if there is another medication to try as an alternative.  Pt has tried/failed metformin 500mg  and Januvia 100mg   [name] is not covered by insurance.  Please advise Dr Kriste BasqueNadel. Thanks.    Will hold in triage as well until PA form received.

## 2015-02-04 NOTE — Telephone Encounter (Signed)
Spoke with pt and is aware that we are waiting for prior authorization from [employer] company. Pt informed that there is a discount card that he can use to bring down the price to $4. Pt stated for write to hold on to card and he or his mother would pick it up on Monday morning. Pt asked by Clinical research associatewriter how he was feeling and how high his blood sugars have been running. Pt states that when he checked it a few hours ago it was 273. Per SN, pt advised to watch the sweets and carbs and to make sure to drink plenty of fluid to help bring down glucose levels. Pt voiced understanding of these instructions. Nothing further needed until prior authorization is received.

## 2015-02-08 ENCOUNTER — Telehealth: Payer: Self-pay | Admitting: *Deleted

## 2015-02-08 NOTE — Telephone Encounter (Signed)
Received PA request for [name] 25-30 mg Pa request submitted via covermymeds Key: [id number] Please allow up to 5 business days for decision.

## 2015-02-09 ENCOUNTER — Other Ambulatory Visit: Payer: Self-pay | Admitting: Pulmonary Disease

## 2015-02-09 DIAGNOSIS — E119 Type 2 diabetes mellitus without complications: Secondary | ICD-10-CM

## 2015-02-09 NOTE — Telephone Encounter (Signed)
Called and spoke to pt's wife, Scott Thomas. Informed her we are working on PA and apologized for the delay. Scott Thomas verbalized understanding and stated she would pick up the $4 discount card. Informed her I will call back after speaking with [facility].   Called [facility] and initiated PA over the phone (not official till PA form faxed back, PA form faxed to triage). The PA will be denied for the pt's Onseni because the pt has not tried and failed Franceradjenta, MaddockJentadueto, DorrOnglyza, or Kombiglyze.   SN please advise further.   Allergies  Allergen Reactions  . Metformin     REACTION: causes nausea    Current Outpatient Prescriptions on File Prior to Visit  Medication Sig Dispense Refill  . Alogliptin-Pioglitazone 25-30 MG TABS Take 1 tablet by mouth daily. 30 tablet 4  . ALPRAZolam (XANAX) 0.25 MG tablet Take 1 tablet (0.25 mg total) by mouth daily as needed for anxiety. 30 tablet 1  . atenolol (TENORMIN) 100 MG tablet TAKE ONE TABLET BY MOUTH EVERY DAY 30 tablet 11  . glimepiride (AMARYL) 4 MG tablet TAKE ONE TABLET BY MOUTH ONCE DAILY BEFORE BREAKFAST 30 tablet 2  . glucose blood (BAYER CONTOUR TEST) test strip Use as instructed 50 each 6  . losartan (COZAAR) 100 MG tablet TAKE ONE TABLET BY MOUTH ONCE DAILY 30 tablet 11  . losartan (COZAAR) 100 MG tablet Take 1 tablet (100 mg total) by mouth daily. 30 tablet 0  . omeprazole (PRILOSEC) 20 MG capsule TAKE ONE CAPSULE BY MOUTH EVERY DAY 30 capsule 6  . sertraline (ZOLOFT) 100 MG tablet TAKE ONE TABLET BY MOUTH ONCE DAILY 30 tablet 0  . sertraline (ZOLOFT) 100 MG tablet TAKE ONE TABLET BY MOUTH ONCE DAILY 30 tablet 5   No current facility-administered medications on file prior to visit.

## 2015-02-09 NOTE — Telephone Encounter (Signed)
Scott Thomas- Pt is very upset and wants a call back ASAP. " There is no reason this PA should not have been done yet. 3 days this week have already been wasted."  She would like to give the phone number to [facility] to get this taken care of in 10 minutes.   Call [phone]

## 2015-02-09 NOTE — Telephone Encounter (Signed)
Per SN, urgent referral was entered for endocrinology for evaluation of elevated blood sugars as pt is limited at what medications he can take to control his diabetes. SN feels this is the best solution for better control of his blood sugars. Writer spoke with pt's mother and wife about the referral and that it was placed urgently. Both were instructed by writer to call office back if not contacted about the referral. Pt's wife stated that she activated discount card that writer gave and was going to try it at the pharmacy. Nothing further is needed at this time.   

## 2015-02-09 NOTE — Telephone Encounter (Signed)
Per SN, urgent referral was entered for endocrinology for evaluation of elevated blood sugars as pt is limited at what medications he can take to control his diabetes. SN feels this is the best solution for better control of his blood sugars. Writer spoke with pt's mother and wife about the referral and that it was placed urgently. Both were instructed by writer to call office back if not contacted about the referral. Pt's wife stated that she activated discount card that writer gave and was going to try it at the pharmacy. Nothing further is needed at this time.

## 2015-02-14 ENCOUNTER — Telehealth: Payer: Self-pay | Admitting: Pulmonary Disease

## 2015-02-14 NOTE — Telephone Encounter (Signed)
PA for [name] 25-30 mg #30 Denied Pt must try and fail: Tradjenta, Jentadueto, Onglyza and or Kombiglize XR It had been noted patient has intolerance to Metformin.

## 2015-02-15 ENCOUNTER — Telehealth: Payer: Self-pay

## 2015-02-15 NOTE — Telephone Encounter (Signed)
Spoke with pt this afternoon and denial from insurance in regards to oseni and that he would have to try and fail either tradjenta, jentadeuto, onglyza and/or kombiglize XR. Pt stated that since we gave him the discount card last week he oseni now is $50 without insurance. Pt declined at this time to try any of the above medicines and wants to talk to the endocrinologist on 03/03/15. Pt informed to make sure to keep appt. He stated that he would. Nothing further is needed at this time.

## 2015-03-03 ENCOUNTER — Ambulatory Visit: Payer: Self-pay | Admitting: Endocrinology

## 2015-03-03 ENCOUNTER — Encounter: Payer: Self-pay | Admitting: *Deleted

## 2015-03-03 DIAGNOSIS — Z0289 Encounter for other administrative examinations: Secondary | ICD-10-CM

## 2015-03-09 ENCOUNTER — Other Ambulatory Visit: Payer: Self-pay | Admitting: Pulmonary Disease

## 2015-04-12 ENCOUNTER — Other Ambulatory Visit: Payer: Self-pay | Admitting: Pulmonary Disease

## 2015-05-19 ENCOUNTER — Other Ambulatory Visit: Payer: Self-pay | Admitting: Pulmonary Disease

## 2015-06-14 ENCOUNTER — Telehealth: Payer: Self-pay | Admitting: Pulmonary Disease

## 2015-06-14 ENCOUNTER — Other Ambulatory Visit: Payer: Self-pay | Admitting: Pulmonary Disease

## 2015-06-14 NOTE — Telephone Encounter (Signed)
Called and spoke to pt's wife Wife stated that pt needed refills on sertaline and oseni Wife stated that pt has not been to see diabetic specialist yet due to no insurance coverage  Pt last seen in office 01/20/2015 Future appt 07/22/2015  Dr Kriste Basque, please advise if you are ok refilling medication

## 2015-06-15 ENCOUNTER — Other Ambulatory Visit: Payer: Self-pay | Admitting: Pulmonary Disease

## 2015-06-15 ENCOUNTER — Telehealth: Payer: Self-pay | Admitting: Pulmonary Disease

## 2015-06-15 DIAGNOSIS — I10 Essential (primary) hypertension: Secondary | ICD-10-CM

## 2015-06-15 MED ORDER — SERTRALINE HCL 100 MG PO TABS: 100.0000 mg | ORAL_TABLET | Freq: Every day | ORAL | Status: DC

## 2015-06-15 MED ORDER — LOSARTAN POTASSIUM 100 MG PO TABS
100.0000 mg | ORAL_TABLET | Freq: Every day | ORAL | Status: DC
Start: 2015-06-15 — End: 2015-07-27

## 2015-06-15 MED ORDER — ALOGLIPTIN-PIOGLITAZONE 25-30 MG PO TABS: 1.0000 | ORAL_TABLET | Freq: Every day | ORAL | Status: DC

## 2015-06-15 NOTE — Telephone Encounter (Signed)
Patient's wife calling back.  States she has not heard anything and RX's have not been called in as of yet.  Patient has been out of meds since [facility] and she is worried since he is diabetic.

## 2015-06-15 NOTE — Telephone Encounter (Signed)
Fleet Contras, have you received a response back yet?

## 2015-06-15 NOTE — Telephone Encounter (Signed)
Refills sent to pt's pharmacy  Pt's wife notified

## 2015-06-15 NOTE — Telephone Encounter (Signed)
Wife called requesting Losartan refill. Sending in to [facility] on [address].

## 2015-07-22 ENCOUNTER — Ambulatory Visit: Payer: Self-pay | Admitting: Pulmonary Disease

## 2015-07-27 ENCOUNTER — Other Ambulatory Visit: Payer: Self-pay | Admitting: *Deleted

## 2015-07-27 DIAGNOSIS — I10 Essential (primary) hypertension: Secondary | ICD-10-CM

## 2015-07-27 MED ORDER — LOSARTAN POTASSIUM 100 MG PO TABS: 100.0000 mg | ORAL_TABLET | Freq: Every day | ORAL | Status: DC

## 2015-08-02 ENCOUNTER — Encounter: Payer: Self-pay | Admitting: Pulmonary Disease

## 2015-08-02 ENCOUNTER — Other Ambulatory Visit (INDEPENDENT_AMBULATORY_CARE_PROVIDER_SITE_OTHER): Payer: Self-pay

## 2015-08-02 ENCOUNTER — Ambulatory Visit (INDEPENDENT_AMBULATORY_CARE_PROVIDER_SITE_OTHER): Payer: Self-pay | Admitting: Pulmonary Disease

## 2015-08-02 VITALS — BP 148/80 | HR 61 | Temp 98.1°F | Ht >= 80 in | Wt 316.4 lb

## 2015-08-02 DIAGNOSIS — E119 Type 2 diabetes mellitus without complications: Secondary | ICD-10-CM

## 2015-08-02 DIAGNOSIS — F411 Generalized anxiety disorder: Secondary | ICD-10-CM

## 2015-08-02 DIAGNOSIS — I10 Essential (primary) hypertension: Secondary | ICD-10-CM

## 2015-08-02 DIAGNOSIS — Z87891 Personal history of nicotine dependence: Secondary | ICD-10-CM

## 2015-08-02 DIAGNOSIS — E7800 Pure hypercholesterolemia, unspecified: Secondary | ICD-10-CM

## 2015-08-02 DIAGNOSIS — E663 Overweight: Secondary | ICD-10-CM

## 2015-08-02 DIAGNOSIS — K219 Gastro-esophageal reflux disease without esophagitis: Secondary | ICD-10-CM

## 2015-08-02 DIAGNOSIS — J439 Emphysema, unspecified: Secondary | ICD-10-CM

## 2015-08-02 LAB — LIPID PANEL
CHOL/HDL RATIO: 4
Cholesterol: 150 mg/dL (ref 0–200)
HDL: 37.4 mg/dL — AB (ref >39.00)
LDL CALC: 101 mg/dL — AB (ref 0–99)
NonHDL: 112.24
TRIGLYCERIDES: 56 mg/dL (ref 0.0–149.0)
VLDL: 11.2 mg/dL (ref 0.0–40.0)

## 2015-08-02 LAB — CBC WITH DIFFERENTIAL/PLATELET
BASOS PCT: 0.3 % (ref 0.0–3.0)
Basophils Absolute: 0 10*3/uL (ref 0.0–0.1)
EOS ABS: 0.1 10*3/uL (ref 0.0–0.7)
EOS PCT: 2.8 % (ref 0.0–5.0)
HEMATOCRIT: 44.1 % (ref 39.0–52.0)
HEMOGLOBIN: 14.5 g/dL (ref 13.0–17.0)
LYMPHS PCT: 33.1 % (ref 12.0–46.0)
Lymphs Abs: 1.5 10*3/uL (ref 0.7–4.0)
MCHC: 32.9 g/dL (ref 30.0–36.0)
MCV: 86.7 fl (ref 78.0–100.0)
Monocytes Absolute: 0.4 10*3/uL (ref 0.1–1.0)
Monocytes Relative: 8.5 % (ref 3.0–12.0)
Neutro Abs: 2.4 10*3/uL (ref 1.4–7.7)
Neutrophils Relative %: 55.3 % (ref 43.0–77.0)
Platelets: 227 10*3/uL (ref 150.0–400.0)
RBC: 5.09 Mil/uL (ref 4.22–5.81)
RDW: 14.3 % (ref 11.5–15.5)
WBC: 4.4 10*3/uL (ref 4.0–10.5)

## 2015-08-02 LAB — HEPATIC FUNCTION PANEL
ALT: 31 U/L (ref 0–53)
AST: 19 U/L (ref 0–37)
Albumin: 4.1 g/dL (ref 3.5–5.2)
Alkaline Phosphatase: 49 U/L (ref 39–117)
BILIRUBIN TOTAL: 0.7 mg/dL (ref 0.2–1.2)
Bilirubin, Direct: 0.2 mg/dL (ref 0.0–0.3)
Total Protein: 7 g/dL (ref 6.0–8.3)

## 2015-08-02 LAB — TSH: TSH: 0.78 u[IU]/mL (ref 0.35–4.50)

## 2015-08-02 LAB — BASIC METABOLIC PANEL
BUN: 9 mg/dL (ref 6–23)
CO2: 29 mEq/L (ref 19–32)
CREATININE: 0.94 mg/dL (ref 0.40–1.50)
Calcium: 9.3 mg/dL (ref 8.4–10.5)
Chloride: 105 mEq/L (ref 96–112)
GFR: 117.62 mL/min (ref >60.00)
Glucose, Bld: 190 mg/dL — ABNORMAL HIGH (ref 70–99)
Potassium: 4.4 mEq/L (ref 3.5–5.1)
Sodium: 138 mEq/L (ref 135–145)

## 2015-08-02 LAB — HEMOGLOBIN A1C: Hgb A1c MFr Bld: 7.5 % — ABNORMAL HIGH (ref 4.6–6.5)

## 2015-08-02 MED ORDER — ALPRAZOLAM 0.25 MG PO TABS: 0.2500 mg | ORAL_TABLET | Freq: Every day | ORAL | Status: DC | PRN

## 2015-08-02 MED ORDER — GLIMEPIRIDE 4 MG PO TABS: ORAL_TABLET | ORAL | Status: DC

## 2015-08-02 MED ORDER — ATENOLOL 100 MG PO TABS: 100.0000 mg | ORAL_TABLET | Freq: Every day | ORAL | Status: DC

## 2015-08-02 MED ORDER — SERTRALINE HCL 100 MG PO TABS
ORAL_TABLET | ORAL | Status: DC
Start: 2015-08-02 — End: 2015-11-30

## 2015-08-02 MED ORDER — OMEPRAZOLE 20 MG PO CPDR: DELAYED_RELEASE_CAPSULE | ORAL | Status: DC

## 2015-08-02 MED ORDER — ALOGLIPTIN-PIOGLITAZONE 25-30 MG PO TABS: 1.0000 | ORAL_TABLET | Freq: Every day | ORAL | Status: DC

## 2015-08-02 NOTE — Patient Instructions (Addendum)
Today we updated your med list in our EPIC system...    Continue your current medications the same...    We refilled the meds you requested...  Today we did your follow up FASTING blood work...    We will contact you w/ the results when available...   Let's  get on track w/ diet/ exercise/ and WEIGHT REDUCTION!!!    You know what you've got to do...  Call for any questions...  Let's plan a follow up visit in [id number], sooner if needed for problems.Marland Kitchen..Marland Kitchen
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
## 2015-08-15 ENCOUNTER — Other Ambulatory Visit: Payer: Self-pay | Admitting: Pulmonary Disease

## 2015-09-19 ENCOUNTER — Other Ambulatory Visit: Payer: Self-pay | Admitting: Pulmonary Disease

## 2015-09-21 ENCOUNTER — Telehealth: Payer: Self-pay | Admitting: Pulmonary Disease

## 2015-09-21 NOTE — Telephone Encounter (Signed)
Spoke with pt's wife, states that he has no insurance and now his diabetic medication ([name]) is over $300/month.  Pt's wife requesting financial assistance forms.  Forms have been printed and left up front to be picked up by a family member.  Pt's wife aware to bring back completed forms to be faxed by our office.  Nothing further needed at this time.

## 2015-09-26 ENCOUNTER — Telehealth: Payer: Self-pay | Admitting: Pulmonary Disease

## 2015-09-26 NOTE — Telephone Encounter (Signed)
Placed in SN folder up front

## 2015-09-27 NOTE — Telephone Encounter (Signed)
Paperwork received and filled out accordingly  Paperwork is on SN cart waiting for signature  Will contact pt once complete

## 2015-09-28 NOTE — Telephone Encounter (Signed)
Paperwork for financial assistance signed by SN this morning Pt called to notify that paperwork was ready. Was asked to fax paperwork in for pt  Paperwork faxed and confirmation sheet received  Paperwork placed in SN scan folder  Nothing further is needed

## 2015-10-04 ENCOUNTER — Telehealth: Payer: Self-pay | Admitting: Pulmonary Disease

## 2015-10-04 NOTE — Telephone Encounter (Signed)
Spoke with pt's wife, wanting to check the status of pt's [name] paperwork.  Pt's wife wants to know the name of the pharmacy that this medication was shipped to so she can check the status herself.  There is no documentation of the pharmacy in previous telephone notes talking about this medication, no mail order pharmacy in pt's chart, and no paperwork regarding pt's pt assistance forms faxed into pt's chart.  This is a primary care medication and I am unfamiliar with this medication and pt assistance process.  Fleet ContrasRachel please advise on what company/pharmacy this medication was sent to.  Thanks!

## 2015-10-04 NOTE — Telephone Encounter (Signed)
Forms faxed to [facility] with [facility] for pt's [name] on 09/26/2015  Fax # [phone]  Received letter from Help at Hand program on 09/29/15 stating that paperwork would not be processed until financial documents showing proof of income were attached Talked to pt's wife and informed of the above Wife stated that pt is not currently working and has not worked for a while and that she does not have anything showing proof of icome She stated that she would bring a letter to office this week stating that pt is not currently working so that this can be attached to form  Informed wife that i would hold on to the paperwork until she brings letter to office so that forms can be refaxed Paperwork placed in my to do folder

## 2015-10-05 NOTE — Telephone Encounter (Signed)
Will send to Rachel/Elise to ensure that this gets signed and sent back.

## 2015-10-05 NOTE — Telephone Encounter (Signed)
Patient's wife dropped of financial info for patient assistance program.  This was placed in Dr. Jodelle GreenNadel's folder up front.

## 2015-10-05 NOTE — Telephone Encounter (Signed)
Letter received from pt today stating that he is unemployed and currently does not have any income Letter was attached to paperwork for patient assistance for medication [name] and faxed to [facility] [employer] [facility] at [phone] Confirmation fax received   Paperwork in my to do folder waiting on response Will hold message in my box

## 2015-10-07 NOTE — Telephone Encounter (Signed)
No update received yet on pt's assistance. Will update chart when a response has been received.

## 2015-10-11 NOTE — Telephone Encounter (Signed)
Scott Thomas, have you received anything back on Patient Assistance for [name]? Please advise. Thanks.

## 2015-10-11 NOTE — Telephone Encounter (Signed)
Spoke with Fleet ContrasRachel, so far nothing has been received on this.

## 2015-10-12 NOTE — Telephone Encounter (Signed)
No response received yet. Will await fax

## 2015-10-13 NOTE — Telephone Encounter (Signed)
Patients wife is calling on status of financial assistance paperwork.  [phone]

## 2015-10-13 NOTE — Telephone Encounter (Signed)
Spoke with pt's wife, advised that we are still waiting on a final determination on pt's patient assistance.  Will continue to hold and follow up on.

## 2015-10-14 NOTE — Telephone Encounter (Signed)
No response recieved. Awaiting fax

## 2015-10-18 NOTE — Telephone Encounter (Signed)
Called [employer] ([employer]) at [phone] to obtain status of Patient assistance program for [name]. Spoke with OptometristBill at [employer].  He says that application is still in process.  It has been received on 12/15.  Annette StableBill says that they did not receive any income information, he received the letter and he cannot accept the letter from patient, but can take a letter from Dr. Kriste BasqueNadel on our letterhead stating that patient has no income.  Must put case number on letter.    Case Number: [id number] Fax: [phone]  [facility] Patient's wife has been notified of above. Awaiting response.

## 2015-10-21 NOTE — Telephone Encounter (Signed)
[facility]. She reports this was approved on 10/19/15. Medication is in process and can take about 1-2 business days and then takes about 5-7 business days to ship medication.  Called spoke with spouse and made her aware. She verbalized understanding and needed nothing further

## 2015-11-26 ENCOUNTER — Encounter (HOSPITAL_COMMUNITY): Payer: Self-pay | Admitting: Emergency Medicine

## 2015-11-26 ENCOUNTER — Emergency Department (HOSPITAL_COMMUNITY): Payer: Self-pay

## 2015-11-26 ENCOUNTER — Emergency Department (HOSPITAL_COMMUNITY)
Admission: EM | Admit: 2015-11-26 | Discharge: 2015-11-26 | Disposition: A | Discharge status: Home / Self Care | Payer: Self-pay | Attending: Emergency Medicine | Admitting: Emergency Medicine

## 2015-11-26 DIAGNOSIS — Z79899 Other long term (current) drug therapy: Secondary | ICD-10-CM | POA: Insufficient documentation

## 2015-11-26 DIAGNOSIS — I10 Essential (primary) hypertension: Secondary | ICD-10-CM | POA: Insufficient documentation

## 2015-11-26 DIAGNOSIS — S62614A Displaced fracture of proximal phalanx of right ring finger, initial encounter for closed fracture: Secondary | ICD-10-CM | POA: Insufficient documentation

## 2015-11-26 DIAGNOSIS — S62609A Fracture of unspecified phalanx of unspecified finger, initial encounter for closed fracture: Secondary | ICD-10-CM

## 2015-11-26 DIAGNOSIS — E119 Type 2 diabetes mellitus without complications: Secondary | ICD-10-CM | POA: Insufficient documentation

## 2015-11-26 DIAGNOSIS — Y9389 Activity, other specified: Secondary | ICD-10-CM | POA: Insufficient documentation

## 2015-11-26 DIAGNOSIS — Y92410 Unspecified street and highway as the place of occurrence of the external cause: Secondary | ICD-10-CM | POA: Insufficient documentation

## 2015-11-26 DIAGNOSIS — Z7984 Long term (current) use of oral hypoglycemic drugs: Secondary | ICD-10-CM | POA: Insufficient documentation

## 2015-11-26 DIAGNOSIS — Y998 Other external cause status: Secondary | ICD-10-CM | POA: Insufficient documentation

## 2015-11-26 DIAGNOSIS — F17200 Nicotine dependence, unspecified, uncomplicated: Secondary | ICD-10-CM | POA: Insufficient documentation

## 2015-11-26 LAB — CBG MONITORING, ED: Glucose-Capillary: 172 mg/dL — ABNORMAL HIGH (ref 65–99)

## 2015-11-26 MED ORDER — OXYCODONE-ACETAMINOPHEN 5-325 MG PO TABS: 1.0000 | ORAL_TABLET | ORAL | Status: DC | PRN

## 2015-11-26 MED ORDER — HYDROCODONE-ACETAMINOPHEN 5-325 MG PO TABS
1.0000 | ORAL_TABLET | Freq: Once | ORAL | Status: AC
  Administered 2015-11-26: 1 via ORAL
  Filled 2015-11-26: qty 1
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
## 2015-11-26 NOTE — Discharge Instructions (Signed)
Finger Fracture  Fractures of fingers are breaks in the bones of the fingers. There are many types of fractures. There are different ways of treating these fractures. Your health care provider will discuss the best way to treat your fracture.  CAUSES  Traumatic injury is the main cause of broken fingers. These include:  · Injuries while playing sports.  · Workplace injuries.  · Falls.  RISK FACTORS  Activities that can increase your risk of finger fractures include:  · Sports.  · Workplace activities that involve machinery.  · A condition called osteoporosis, which can make your bones less dense and cause them to fracture more easily.  SIGNS AND SYMPTOMS  The main symptoms of a broken finger are pain and swelling within 15 minutes after the injury. Other symptoms include:  · Bruising of your finger.  · Stiffness of your finger.  · Numbness of your finger.  · Exposed bones (compound fracture) if the fracture is severe.  DIAGNOSIS   The best way to diagnose a broken bone is with X-ray imaging. Additionally, your health care provider will use this X-ray image to evaluate the position of the broken finger bones.   TREATMENT   Finger fractures can be treated with:   · Nonreduction--This means the bones are in place. The finger is splinted without changing the positions of the bone pieces. The splint is usually left on for about a week to 10 days. This will depend on your fracture and what your health care provider thinks.  · Closed reduction--The bones are put back into position without using surgery. The finger is then splinted.  · Open reduction and internal fixation--The fracture site is opened. Then the bone pieces are fixed into place with pins or some type of hardware. This is seldom required. It depends on the severity of the fracture.  HOME CARE INSTRUCTIONS   · Follow your health care provider's instructions regarding activities, exercises, and physical therapy.  · Only take over-the-counter or prescription  medicines for pain, discomfort, or fever as directed by your health care provider.  SEEK MEDICAL CARE IF:  You have pain or swelling that limits the motion or use of your fingers.  SEEK IMMEDIATE MEDICAL CARE IF:   Your finger becomes numb.  MAKE SURE YOU:   · Understand these instructions.  · Will watch your condition.  · Will get help right away if you are not doing well or get worse.     This information is not intended to replace advice given to you by your health care provider. Make sure you discuss any questions you have with your health care provider.     Document Released: 01/13/2001 Document Revised: 07/22/2013 Document Reviewed: 05/13/2013  Elsevier Interactive Patient Education ©2016 Elsevier Inc.

## 2015-11-29 ENCOUNTER — Other Ambulatory Visit: Payer: Self-pay | Admitting: Pulmonary Disease

## 2015-11-30 ENCOUNTER — Other Ambulatory Visit: Payer: Self-pay | Admitting: Orthopedic Surgery

## 2015-12-02 ENCOUNTER — Other Ambulatory Visit (HOSPITAL_COMMUNITY): Payer: Self-pay | Admitting: *Deleted

## 2015-12-02 ENCOUNTER — Encounter (HOSPITAL_COMMUNITY): Payer: Self-pay

## 2015-12-02 ENCOUNTER — Other Ambulatory Visit: Payer: Self-pay

## 2015-12-02 ENCOUNTER — Encounter (HOSPITAL_COMMUNITY)
Admission: RE | Admit: 2015-12-02 | Discharge: 2015-12-02 | Disposition: A | Discharge status: Home / Self Care | Payer: Self-pay | Source: Ambulatory Visit | Attending: Orthopedic Surgery | Admitting: Orthopedic Surgery

## 2015-12-02 HISTORY — DX: Gastro-esophageal reflux disease without esophagitis: K21.9

## 2015-12-02 HISTORY — DX: Anxiety disorder, unspecified: F41.9

## 2015-12-02 HISTORY — DX: Headache: R51

## 2015-12-02 HISTORY — DX: Headache, unspecified: R51.9

## 2015-12-02 LAB — BASIC METABOLIC PANEL
ANION GAP: 8 (ref 5–15)
BUN: 7 mg/dL (ref 6–20)
CHLORIDE: 106 mmol/L (ref 101–111)
CO2: 27 mmol/L (ref 22–32)
CREATININE: 0.92 mg/dL (ref 0.61–1.24)
Calcium: 9.4 mg/dL (ref 8.9–10.3)
GFR calc Af Amer: >60 mL/min (ref >60)
GFR calc non Af Amer: >60 mL/min (ref >60)
GLUCOSE: 152 mg/dL — AB (ref 65–99)
Potassium: 4.3 mmol/L (ref 3.5–5.1)
SODIUM: 141 mmol/L (ref 135–145)

## 2015-12-02 LAB — CBC
HCT: 42.3 % (ref 39.0–52.0)
HEMOGLOBIN: 14.1 g/dL (ref 13.0–17.0)
MCH: 29.4 pg (ref 26.0–34.0)
MCHC: 33.3 g/dL (ref 30.0–36.0)
MCV: 88.1 fL (ref 78.0–100.0)
Platelets: 214 10*3/uL (ref 150–400)
RBC: 4.8 MIL/uL (ref 4.22–5.81)
RDW: 14 % (ref 11.5–15.5)
WBC: 4.1 10*3/uL (ref 4.0–10.5)

## 2015-12-02 LAB — GLUCOSE, CAPILLARY: GLUCOSE-CAPILLARY: 135 mg/dL — AB (ref 65–99)

## 2015-12-02 MED ORDER — CHLORHEXIDINE GLUCONATE 4 % EX LIQD: 60.0000 mL | Freq: Once | CUTANEOUS | Status: DC

## 2015-12-02 MED ORDER — DEXTROSE 5 % IV SOLN
3.0000 g | INTRAVENOUS | Status: AC
  Administered 2015-12-03: 3 g via INTRAVENOUS
  Filled 2015-12-02: qty 3000

## 2015-12-02 NOTE — Pre-Procedure Instructions (Signed)
[place]  12/02/2015      Your procedure is scheduled on Saturday, December 03, 2015 at 2:00 PM.   Report to [facility] Emergency Department Registration at 12:00 Noon.   Call this number if you have problems the morning of surgery: [phone]     Remember:  Do not eat food or drink liquids after midnight tonight.   Take these medicines the morning of surgery with A SIP OF WATER: Atenolol (Tenormin), Omeprazole (Prilosec), Sertraline (Zoloft), Oxycodone - if needed, Alprazolam (Xanax) - if needed  How to Manage Your Diabetes Before Surgery   Why is it important to control my blood sugar before and after surgery?   Improving blood sugar levels before and after surgery helps healing and can limit problems.  A way of improving blood sugar control is eating a healthy diet by:  - Eating less sugar and carbohydrates  - Increasing activity/exercise  - Talk with your doctor about reaching your blood sugar goals  High blood sugars (greater than 180 mg/dL) can raise your risk of infections and slow down your recovery so you will need to focus on controlling your diabetes during the weeks before surgery.  Make sure that the doctor who takes care of your diabetes knows about your planned surgery including the date and location.  How do I manage my blood sugars before surgery?   Check your blood sugar at least 4 times a today to make sure that they are not too low or too high.  Check your blood sugar the morning of your surgery when you wake up and every 2 hours until you get to the Short-Stay unit.  Treat a low blood sugar (less than 70 mg/dL) with 1/2 cup of clear juice (cranberry or apple), 4 glucose tablets, OR glucose gel.  Recheck blood sugar in 15 minutes after treatment (to make sure it is greater than 70 mg/dL).  If blood sugar is not greater than 70 mg/dL on re-check, call [phone] for further instructions.   Report your blood sugar to the Short-Stay nurse  when you get to Short-Stay.  References:  [employer], 2007 "How to Manage your Diabetes Before and After Surgery".  What do I do about my diabetes medications?   Do not take oral diabetes medicines (pills) the morning of surgery.   Do not wear jewelry.  Do not wear lotions, powders, or cologne.  You may wear deodorant.  Men may shave face and neck.  Do not bring valuables to the hospital.  [facility] is not responsible for any belongings or valuables.  Contacts, dentures or bridgework may not be worn into surgery.  Leave your suitcase in the car.  After surgery it may be brought to your room.  For patients admitted to the hospital, discharge time will be determined by your treatment team.  Patients discharged the day of surgery will not be allowed to drive home.   Special instructions:  [facility] - Preparing for Surgery  Before surgery, you can play an important role.  Because skin is not sterile, your skin needs to be as free of germs as possible.  You can reduce the number of germs on you skin by washing with CHG (chlorahexidine gluconate) soap before surgery.  CHG is an antiseptic cleaner which kills germs and bonds with the skin to continue killing germs even after washing.  Please DO NOT use if you have an allergy to CHG or antibacterial soaps.  If  your skin becomes reddened/irritated stop using the CHG and inform your nurse when you arrive at Short Stay.  Do not shave (including legs and underarms) for at least 48 hours prior to the first CHG shower.  You may shave your face.  Please follow these instructions carefully:   1.  Shower with CHG Soap the night before surgery and the                                morning of Surgery.  2.  If you choose to wash your hair, wash your hair first as usual with your       normal shampoo.  3.  After you shampoo, rinse your hair and body thoroughly to remove the                      Shampoo.  4.  Use CHG as  you would any other liquid soap.  You can apply chg directly       to the skin and wash gently with scrungie or a clean washcloth.  5.  Apply the CHG Soap to your body ONLY FROM THE NECK DOWN.        Do not use on open wounds or open sores.  Avoid contact with your eyes, ears, mouth and genitals (private parts).  Wash genitals (private parts) with your normal soap.  6.  Wash thoroughly, paying special attention to the area where your surgery        will be performed.  7.  Thoroughly rinse your body with warm water from the neck down.  8.  DO NOT shower/wash with your normal soap after using and rinsing off       the CHG Soap.  9.  Pat yourself dry with a clean towel.            10.  Wear clean pajamas.            11.  Place clean sheets on your bed the night of your first shower and do not        sleep with pets.  Day of Surgery  Do not apply any lotions the morning of surgery.  Please wear clean clothes to the hospital.  Please read over the following fact sheets that you were given. Pain Booklet, Coughing and Deep Breathing and Surgical Site Infection Prevention

## 2015-12-02 NOTE — Progress Notes (Signed)
Pt denies cardiac history, chest pain or sob. Pt is diabetic, last A1C was 7.5 on 08/02/15. States fasting blood sugar usually run around 100. Today's CBG was 135.

## 2015-12-03 ENCOUNTER — Ambulatory Visit (HOSPITAL_COMMUNITY): Payer: Self-pay | Admitting: Certified Registered Nurse Anesthetist

## 2015-12-03 ENCOUNTER — Encounter (HOSPITAL_COMMUNITY): Payer: Self-pay | Admitting: *Deleted

## 2015-12-03 ENCOUNTER — Ambulatory Visit (HOSPITAL_COMMUNITY): Payer: Self-pay | Admitting: Emergency Medicine

## 2015-12-03 ENCOUNTER — Encounter (HOSPITAL_COMMUNITY)
Admission: RE | Disposition: A | Discharge status: Home / Self Care | Payer: Self-pay | Source: Ambulatory Visit | Attending: Orthopedic Surgery

## 2015-12-03 ENCOUNTER — Ambulatory Visit (HOSPITAL_COMMUNITY)
Admission: RE | Admit: 2015-12-03 | Discharge: 2015-12-03 | Disposition: A | Discharge status: Home / Self Care | Payer: Self-pay | Source: Ambulatory Visit | Attending: Orthopedic Surgery | Admitting: Orthopedic Surgery

## 2015-12-03 DIAGNOSIS — Z79899 Other long term (current) drug therapy: Secondary | ICD-10-CM | POA: Insufficient documentation

## 2015-12-03 DIAGNOSIS — F17210 Nicotine dependence, cigarettes, uncomplicated: Secondary | ICD-10-CM | POA: Insufficient documentation

## 2015-12-03 DIAGNOSIS — S62614A Displaced fracture of proximal phalanx of right ring finger, initial encounter for closed fracture: Secondary | ICD-10-CM | POA: Insufficient documentation

## 2015-12-03 DIAGNOSIS — F419 Anxiety disorder, unspecified: Secondary | ICD-10-CM | POA: Insufficient documentation

## 2015-12-03 DIAGNOSIS — I10 Essential (primary) hypertension: Secondary | ICD-10-CM | POA: Insufficient documentation

## 2015-12-03 DIAGNOSIS — X58XXXA Exposure to other specified factors, initial encounter: Secondary | ICD-10-CM | POA: Insufficient documentation

## 2015-12-03 DIAGNOSIS — E119 Type 2 diabetes mellitus without complications: Secondary | ICD-10-CM | POA: Insufficient documentation

## 2015-12-03 DIAGNOSIS — K219 Gastro-esophageal reflux disease without esophagitis: Secondary | ICD-10-CM | POA: Insufficient documentation

## 2015-12-03 DIAGNOSIS — Z7984 Long term (current) use of oral hypoglycemic drugs: Secondary | ICD-10-CM | POA: Insufficient documentation

## 2015-12-03 HISTORY — PX: CLOSED REDUCTION FINGER WITH PERCUTANEOUS PINNING: SHX5612

## 2015-12-03 LAB — GLUCOSE, CAPILLARY
Glucose-Capillary: 103 mg/dL — ABNORMAL HIGH (ref 65–99)
Glucose-Capillary: 93 mg/dL (ref 65–99)

## 2015-12-03 LAB — HEMOGLOBIN A1C
HEMOGLOBIN A1C: 8 % — AB (ref 4.8–5.6)
Mean Plasma Glucose: 183 mg/dL

## 2015-12-03 SURGERY — CLOSED REDUCTION, FINGER, WITH PERCUTANEOUS PINNING
Anesthesia: Monitor Anesthesia Care | Site: Finger | Laterality: Right

## 2015-12-03 MED ORDER — MIDAZOLAM HCL 5 MG/5ML IJ SOLN
INTRAMUSCULAR | Status: DC | PRN
  Administered 2015-12-03: 2 mg via INTRAVENOUS

## 2015-12-03 MED ORDER — PROPOFOL 500 MG/50ML IV EMUL
INTRAVENOUS | Status: DC | PRN
  Administered 2015-12-03: 50 ug/kg/min via INTRAVENOUS

## 2015-12-03 MED ORDER — FENTANYL CITRATE (PF) 100 MCG/2ML IJ SOLN
INTRAMUSCULAR | Status: DC | PRN
  Administered 2015-12-03 (×3): 25 ug via INTRAVENOUS
  Administered 2015-12-03: 50 ug via INTRAVENOUS
  Administered 2015-12-03: 25 ug via INTRAVENOUS
  Administered 2015-12-03 (×2): 50 ug via INTRAVENOUS

## 2015-12-03 MED ORDER — ACETAMINOPHEN 325 MG PO TABS: 325.0000 mg | ORAL_TABLET | ORAL | Status: DC | PRN

## 2015-12-03 MED ORDER — FENTANYL CITRATE (PF) 250 MCG/5ML IJ SOLN
INTRAMUSCULAR | Status: AC
  Filled 2015-12-03: qty 5

## 2015-12-03 MED ORDER — LIDOCAINE HCL (CARDIAC) 20 MG/ML IV SOLN
INTRAVENOUS | Status: DC | PRN
  Administered 2015-12-03: 50 mg via INTRATRACHEAL

## 2015-12-03 MED ORDER — PROPOFOL 10 MG/ML IV BOLUS
INTRAVENOUS | Status: DC | PRN
  Administered 2015-12-03: 20 mg via INTRAVENOUS
  Administered 2015-12-03 (×3): 10 mg via INTRAVENOUS
  Administered 2015-12-03 (×2): 20 mg via INTRAVENOUS

## 2015-12-03 MED ORDER — FENTANYL CITRATE (PF) 100 MCG/2ML IJ SOLN: 25.0000 ug | INTRAMUSCULAR | Status: DC | PRN

## 2015-12-03 MED ORDER — LACTATED RINGERS IV SOLN
INTRAVENOUS | Status: DC
  Administered 2015-12-03: 14:00:00 via INTRAVENOUS

## 2015-12-03 MED ORDER — OXYCODONE HCL 5 MG PO TABS: 5.0000 mg | ORAL_TABLET | Freq: Once | ORAL | Status: DC | PRN

## 2015-12-03 MED ORDER — MIDAZOLAM HCL 2 MG/2ML IJ SOLN
INTRAMUSCULAR | Status: AC
  Filled 2015-12-03: qty 2

## 2015-12-03 MED ORDER — OXYCODONE HCL 5 MG/5ML PO SOLN: 5.0000 mg | Freq: Once | ORAL | Status: DC | PRN

## 2015-12-03 MED ORDER — OXYCODONE HCL 5 MG PO TABS: 10.0000 mg | ORAL_TABLET | ORAL | Status: DC | PRN

## 2015-12-03 MED ORDER — BUPIVACAINE-EPINEPHRINE (PF) 0.5% -1:200000 IJ SOLN
INTRAMUSCULAR | Status: DC | PRN
  Administered 2015-12-03: 30 mL via PERINEURAL

## 2015-12-03 MED ORDER — ACETAMINOPHEN 160 MG/5ML PO SOLN
325.0000 mg | ORAL | Status: DC | PRN
  Filled 2015-12-03: qty 20.3

## 2015-12-03 MED ORDER — 0.9 % SODIUM CHLORIDE (POUR BTL) OPTIME
TOPICAL | Status: DC | PRN
  Administered 2015-12-03: 1000 mL

## 2015-12-03 MED ORDER — SULFAMETHOXAZOLE-TRIMETHOPRIM 800-160 MG PO TABS: 1.0000 | ORAL_TABLET | Freq: Two times a day (BID) | ORAL | Status: DC

## 2015-12-03 MED ORDER — ONDANSETRON HCL 4 MG/2ML IJ SOLN
INTRAMUSCULAR | Status: DC | PRN
  Administered 2015-12-03: 4 mg via INTRAVENOUS

## 2015-12-03 SURGICAL SUPPLY — 48 items
BANDAGE ACE 3X5.8 VEL STRL LF (GAUZE/BANDAGES/DRESSINGS) ×6 IMPLANT
BANDAGE ELASTIC 3 VELCRO ST LF (GAUZE/BANDAGES/DRESSINGS) IMPLANT
BANDAGE ELASTIC 4 VELCRO ST LF (GAUZE/BANDAGES/DRESSINGS) IMPLANT
BENZOIN TINCTURE PRP APPL 2/3 (GAUZE/BANDAGES/DRESSINGS) IMPLANT
BIT DRILL 1.1 (BIT) ×1
BIT DRILL 1.1MM (BIT) ×1
BIT DRILL 60X20X1.1XQC TMX (BIT) ×1 IMPLANT
BIT DRL 60X20X1.1XQC TMX (BIT) ×1
BLADE SURG ROTATE 9660 (MISCELLANEOUS) IMPLANT
BNDG CONFORM 2 STRL LF (GAUZE/BANDAGES/DRESSINGS) ×6 IMPLANT
BNDG GAUZE ELAST 4 BULKY (GAUZE/BANDAGES/DRESSINGS) ×3 IMPLANT
CLOSURE WOUND 1/2 X4 (GAUZE/BANDAGES/DRESSINGS)
COVER SURGICAL LIGHT HANDLE (MISCELLANEOUS) ×3 IMPLANT
CUFF TOURNIQUET SINGLE 18IN (TOURNIQUET CUFF) ×3 IMPLANT
CUFF TOURNIQUET SINGLE 24IN (TOURNIQUET CUFF) IMPLANT
DRSG ADAPTIC 3X8 NADH LF (GAUZE/BANDAGES/DRESSINGS) ×3 IMPLANT
DRSG EMULSION OIL 3X3 NADH (GAUZE/BANDAGES/DRESSINGS) IMPLANT
GAUZE SPONGE 4X4 12PLY STRL (GAUZE/BANDAGES/DRESSINGS) IMPLANT
GAUZE XEROFORM 1X8 LF (GAUZE/BANDAGES/DRESSINGS) IMPLANT
GAUZE XEROFORM 5X9 LF (GAUZE/BANDAGES/DRESSINGS) ×3 IMPLANT
GLOVE BIOGEL M STRL SZ7.5 (GLOVE) ×3 IMPLANT
GLOVE SS BIOGEL STRL SZ 8 (GLOVE) ×1 IMPLANT
GLOVE SUPERSENSE BIOGEL SZ 8 (GLOVE) ×2
GOWN STRL REUS W/ TWL LRG LVL3 (GOWN DISPOSABLE) ×1 IMPLANT
GOWN STRL REUS W/ TWL XL LVL3 (GOWN DISPOSABLE) ×1 IMPLANT
GOWN STRL REUS W/TWL LRG LVL3 (GOWN DISPOSABLE) ×2
GOWN STRL REUS W/TWL XL LVL3 (GOWN DISPOSABLE) ×2
KIT BASIN OR (CUSTOM PROCEDURE TRAY) ×3 IMPLANT
KIT ROOM TURNOVER OR (KITS) ×3 IMPLANT
MANIFOLD NEPTUNE II (INSTRUMENTS) IMPLANT
NS IRRIG 1000ML POUR BTL (IV SOLUTION) ×3 IMPLANT
PACK ORTHO EXTREMITY (CUSTOM PROCEDURE TRAY) ×3 IMPLANT
PAD ARMBOARD 7.5X6 YLW CONV (MISCELLANEOUS) ×6 IMPLANT
PAD CAST 4YDX4 CTTN HI CHSV (CAST SUPPLIES) ×1 IMPLANT
PADDING CAST COTTON 4X4 STRL (CAST SUPPLIES) ×2
SCREW 1.5X15MM (Screw) ×3 IMPLANT
SCREW NONIOC 1.5 16M (Screw) ×6 IMPLANT
SPLINT FIBERGLASS 3X12 (CAST SUPPLIES) ×3 IMPLANT
SPONGE GAUZE 4X4 12PLY STER LF (GAUZE/BANDAGES/DRESSINGS) ×3 IMPLANT
STRIP CLOSURE SKIN 1/2X4 (GAUZE/BANDAGES/DRESSINGS) IMPLANT
SUT CHROMIC 5 0 P 3 (SUTURE) ×3 IMPLANT
SUT ETHILON 4 0 P 3 18 (SUTURE) IMPLANT
SUT ETHILON 5 0 P 3 18 (SUTURE)
SUT NYLON ETHILON 5-0 P-3 1X18 (SUTURE) IMPLANT
SUT PROLENE 4 0 P 3 18 (SUTURE) IMPLANT
TOWEL OR 17X24 6PK STRL BLUE (TOWEL DISPOSABLE) ×3 IMPLANT
TOWEL OR 17X26 10 PK STRL BLUE (TOWEL DISPOSABLE) ×3 IMPLANT
WATER STERILE IRR 1000ML POUR (IV SOLUTION) ×3 IMPLANT

## 2015-12-03 NOTE — H&P (Signed)
Scott Thomas is an 35 y.o. male.   Chief Complaint: Patient presents for right ring finger fracture reconstruction HPI: Patient presents for right ring finger fracture reconstruction Patient presents for evaluation and treatment of the of their upper extremity predicament. The patient denies neck, back, chest or  abdominal pain. The patient notes that they have no lower extremity problems. The patients primary complaint is noted. We are planning surgical care pathway for the upper extremity. Past Medical History  Diagnosis Date  . Diabetes mellitus   . Hypertension   . Anxiety   . GERD (gastroesophageal reflux disease)   . Headache     tension headaches    Past Surgical History  Procedure Laterality Date  . Wisdom tooth extraction      Family History  Problem Relation Age of Onset  . Diabetes type II Sister   . Hypertension Mother   . Diabetes type II Mother   . Hypertension Father    Social History:  reports that he has been smoking Cigarettes.  He has a 3.5 pack-year smoking history. He has never used smokeless tobacco. He reports that he does not drink alcohol or use illicit drugs.  Allergies:  Allergies  Allergen Reactions  . Metformin     REACTION: causes nausea    Medications Prior to Admission  Medication Sig Dispense Refill  . Alogliptin-Pioglitazone ([name]) 25-30 MG TABS Take by mouth daily.    Marland Kitchen ALPRAZolam (XANAX) 0.25 MG tablet Take 1 tablet (0.25 mg total) by mouth daily as needed for anxiety. 30 tablet 1  . atenolol (TENORMIN) 100 MG tablet Take 1 tablet (100 mg total) by mouth daily. 30 tablet 3  . glimepiride (AMARYL) 4 MG tablet TAKE ONE TABLET BY MOUTH ONCE DAILY BEFORE BREAKFAST 30 tablet 5  . losartan (COZAAR) 100 MG tablet TAKE ONE TABLET BY MOUTH ONCE DAILY. KEEP OFFICE VISIT 30 tablet 11  . omeprazole (PRILOSEC) 20 MG capsule TAKE ONE CAPSULE BY MOUTH EVERY DAY 30 capsule 6  . oxyCODONE-acetaminophen (PERCOCET/ROXICET) 5-325 MG tablet Take 1  tablet by mouth every 4 (four) hours as needed for severe pain. (Patient taking differently: Take 1 tablet by mouth every 4 (four) hours as needed for moderate pain or severe pain. ) 15 tablet 0  . sertraline (ZOLOFT) 100 MG tablet TAKE ONE TABLET BY MOUTH ONCE DAILY 30 tablet 5  . Alogliptin-Pioglitazone 25-30 MG TABS Take 1 tablet by mouth daily. (Patient not taking: Reported on 11/30/2015) 30 tablet 0    Results for orders placed or performed during the hospital encounter of 12/02/15 (from the past 48 hour(s))  Glucose, capillary     Status: Abnormal   Collection Time: 12/02/15 10:11 AM  Result Value Ref Range   Glucose-Capillary 135 (H) 65 - 99 mg/dL  Basic metabolic panel     Status: Abnormal   Collection Time: 12/02/15 10:36 AM  Result Value Ref Range   Sodium 141 135 - 145 mmol/L   Potassium 4.3 3.5 - 5.1 mmol/L   Chloride 106 101 - 111 mmol/L   CO2 27 22 - 32 mmol/L   Glucose, Bld 152 (H) 65 - 99 mg/dL   BUN 7 6 - 20 mg/dL   Creatinine, Ser 0.92 0.61 - 1.24 mg/dL   Calcium 9.4 8.9 - 10.3 mg/dL   GFR calc non Af Amer >60 >60 mL/min   GFR calc Af Amer >60 >60 mL/min    Comment: (NOTE) The eGFR has been calculated using the CKD EPI equation.  This calculation has not been validated in all clinical situations. eGFR's persistently <60 mL/min signify possible Chronic Kidney Disease.    Anion gap 8 5 - 15  CBC     Status: None   Collection Time: 12/02/15 10:36 AM  Result Value Ref Range   WBC 4.1 4.0 - 10.5 K/uL   RBC 4.80 4.22 - 5.81 MIL/uL   Hemoglobin 14.1 13.0 - 17.0 g/dL   HCT 42.3 39.0 - 52.0 %   MCV 88.1 78.0 - 100.0 fL   MCH 29.4 26.0 - 34.0 pg   MCHC 33.3 30.0 - 36.0 g/dL   RDW 14.0 11.5 - 15.5 %   Platelets 214 150 - 400 K/uL  Hemoglobin A1c     Status: Abnormal   Collection Time: 12/02/15 10:37 AM  Result Value Ref Range   Hgb A1c MFr Bld 8.0 (H) 4.8 - 5.6 %    Comment: (NOTE)         Pre-diabetes: 5.7 - 6.4         Diabetes: >6.4         Glycemic control  for adults with diabetes: <7.0    Mean Plasma Glucose 183 mg/dL    Comment: (NOTE) Performed At: [facility] [place], [place] [id number] Lindon Romp MD [id number]    No results found.  Review of Systems  Respiratory: Negative.   Genitourinary: Negative.   Neurological: Negative.   Psychiatric/Behavioral: Negative.     There were no vitals taken for this visit. Physical Exam  Right ring finger displaced fracture with  displacement and poor alignment  The patient is alert and oriented in no acute distress. The patient complains of pain in the affected upper extremity.  The patient is noted to have a normal HEENT exam. Lung fields show equal chest expansion and no shortness of breath. Abdomen exam is nontender without distention. Lower extremity examination does not show any fracture dislocation or blood clot symptoms. Pelvis is stable and the neck and back are stable and nontender. Assessment/Plan We will plan for right ring finger ORIF. I discussed with patient risks and benefits these and does timeframe duration of recovery. We are planning surgery for your upper extremity. The risk and benefits of surgery to include risk of bleeding, infection, anesthesia,  damage to normal structures and failure of the surgery to accomplish its intended goals of relieving symptoms and restoring function have been discussed in detail. With this in mind we plan to proceed. I have specifically discussed with the patient the pre-and postoperative regime and the dos and don'ts and risk and benefits in great detail. Risk and benefits of surgery also include risk of dystrophy(CRPS), chronic nerve pain, failure of the healing process to go onto completion and other inherent risks of surgery The relavent the pathophysiology of the disease/injury process, as well as the alternatives for treatment and postoperative course of action has been discussed in great detail with the  patient who desires to proceed.  We will do everything in our power to help you (the patient) restore function to the upper extremity. It is a pleasure to see this patient today.  Paulene Floor, MD 12/03/2015, 1:08 PM

## 2015-12-03 NOTE — Op Note (Signed)
See dictation#[id number] Amanda Pea MD

## 2015-12-03 NOTE — Anesthesia Postprocedure Evaluation (Signed)
Anesthesia Post Note  Patient: Scott Thomas  Procedure(s) Performed: Procedure(s) (LRB): ORIF RIGHT RING FINGER PROXIMAL PHALANX WITH REPAIR RECONSTRUCTION  (Right)  Patient location during evaluation: PACU Anesthesia Type: MAC and Regional Level of consciousness: awake Pain management: pain level controlled Vital Signs Assessment: post-procedure vital signs reviewed and stable Respiratory status: spontaneous breathing Cardiovascular status: stable Postop Assessment: no signs of nausea or vomiting Anesthetic complications: no    Last Vitals: There were no vitals filed for this visit.  Last Pain:  Filed Vitals:   12/03/15 1328  PainSc: 6                  Keysi Oelkers

## 2015-12-03 NOTE — Anesthesia Preprocedure Evaluation (Addendum)
Anesthesia Evaluation  Patient identified by MRN, date of birth, ID band Patient awake    Reviewed: Allergy & Precautions, NPO status , Patient's Chart, lab work & pertinent test results, reviewed documented beta blocker date and time   History of Anesthesia Complications Negative for: history of anesthetic complications  Airway Mallampati: II  TM Distance: >3 FB Neck ROM: Full    Dental  (+) Teeth Intact   Pulmonary neg shortness of breath, neg sleep apnea, neg COPD, neg recent URI, Current Smoker,    breath sounds clear to auscultation       Cardiovascular hypertension, Pt. on medications and Pt. on home beta blockers  Rhythm:Regular     Neuro/Psych  Headaches, PSYCHIATRIC DISORDERS Anxiety    GI/Hepatic Neg liver ROS, GERD  Medicated and Controlled,  Endo/Other  diabetes, Type 2, Oral Hypoglycemic Agents  Renal/GU negative Renal ROS     Musculoskeletal   Abdominal   Peds  Hematology negative hematology ROS (+)   Anesthesia Other Findings   Reproductive/Obstetrics                           Anesthesia Physical Anesthesia Plan  ASA: II  Anesthesia Plan: MAC and Regional   Post-op Pain Management:    Induction: Intravenous  Airway Management Planned: Nasal Cannula, Natural Airway and Simple Face Mask  Additional Equipment: None  Intra-op Plan:   Post-operative Plan:   Informed Consent: I have reviewed the patients History and Physical, chart, labs and discussed the procedure including the risks, benefits and alternatives for the proposed anesthesia with the patient or authorized representative who has indicated his/her understanding and acceptance.   Dental advisory given  Plan Discussed with: CRNA and Surgeon  Anesthesia Plan Comments:         Anesthesia Quick Evaluation

## 2015-12-03 NOTE — Progress Notes (Signed)
Patient and family notified of delay. 

## 2015-12-03 NOTE — Discharge Instructions (Signed)

## 2015-12-03 NOTE — Progress Notes (Signed)
Orthopedic Tech Progress Note Patient Details:  Scott Thomas 21-Nov-1980 [id number]  Ortho Devices Type of Ortho Device: Arm sling Ortho Device/Splint Location: RUE Ortho Device/Splint Interventions: Ordered, Application   Jennye Moccasin 12/03/2015, 5:36 PM

## 2015-12-03 NOTE — Anesthesia Procedure Notes (Addendum)
Anesthesia Regional Block:  Axillary brachial plexus block  Pre-Anesthetic Checklist: ,, timeout performed, Correct Patient, Correct Site, Correct Laterality, Correct Procedure, Correct Position, site marked, Risks and benefits discussed,  Surgical consent,  Pre-op evaluation,  At surgeon's request and post-op pain management  Laterality: Upper and Right  Prep: chloraprep       Needles:  Injection technique: Single-shot  Needle Type: Echogenic Needle          Additional Needles:  Procedures: ultrasound guided (picture in chart) Axillary brachial plexus block Narrative:  Injection made incrementally with aspirations every 5 mL.  Performed by: Personally   Additional Notes: H+P and labs reviewed, risks and benefits discussed with patient, procedure tolerated well without complications

## 2015-12-03 NOTE — Transfer of Care (Signed)
Immediate Anesthesia Transfer of Care Note  Patient: Scott Thomas  Procedure(s) Performed: Procedure(s) with comments: ORIF RIGHT RING FINGER PROXIMAL PHALANX WITH REPAIR RECONSTRUCTION  (Right) - Ring  Patient Location: PACU  Anesthesia Type:MAC  Level of Consciousness: awake  Airway & Oxygen Therapy: Patient Spontanous Breathing  Post-op Assessment: Report given to RN and Post -op Vital signs reviewed and stable  Post vital signs: Reviewed and stable  Last Vitals: There were no vitals filed for this visit.  Complications: No apparent anesthesia complications

## 2015-12-05 ENCOUNTER — Encounter (HOSPITAL_COMMUNITY): Payer: Self-pay | Admitting: Orthopedic Surgery

## 2015-12-05 NOTE — Op Note (Signed)
NAMEJENNER, ROSIER              ACCOUNT NO.:  [id number]  MEDICAL RECORD NO.:  [id number]  LOCATION:                                 FACILITY:  PHYSICIAN:  Dionne Ano. Bianca Raneri, M.D.DATE OF BIRTH:  02/28/1981  DATE OF PROCEDURE: DATE OF DISCHARGE:                              OPERATIVE REPORT   PREOPERATIVE DIAGNOSIS:  Right ring finger proximal interphalangeal intra-articular fracture at the proximal phalanx region.  POSTOPERATIVE DIAGNOSIS:  Right ring finger proximal interphalangeal intra-articular fracture at the proximal phalanx region.  PROCEDURE: 1. Open reduction and internal fixation, intra-articular proximal     phalanx fracture, right ring finger with ALPS 1.5 mm screws. 2. Stress radiography.  SURGEON:  Dionne Ano. Amanda Pea, MD  ASSISTANT:  None.  COMPLICATION:  None.  ANESTHESIA:  Block with IV sedation.  TOURNIQUET TIME:  Zero.  INDICATIONS:  A pleasant 35 year old male with a displaced fracture.  He presents for evaluation and treatment.  He understands risks and benefits, do's and don'ts.  All questions have been encouraged and answered preoperatively.  OPERATIVE PROCEDURE:  The patient was seen by myself and Anesthesia, taken to the operating suite, smooth induction of IV sedation, prepped and draped with Hibiclens scrub x10 minutes, followed by Betadine scrub and paint x10 minutes, followed by sterile field application being applied.  Once this was complete, time-out was observed.  Preoperative antibiotics were given.  The patient underwent manipulative reduction, followed by small incision made about the ulna mid lateral line. Dissection was carried down and reduction was obtained with towel clamp. Following this, I inserted 3 screws, 1.5 in nature.  I had excellent purchase and the alignment on the AP and lateral plane looked excellent. This restored his rotational malalignment and restored his stability I was pleased with this in the findings.  I  placed him through a full range of motion, all looked well.  He tolerated this well.  We irrigated copiously closed wound with chromic.  AP lateral and oblique x-rays were performed, examined, and interpreted by myself, and looked to be excellent.  Following this, the patient had irrigation applied followed by dressing of Adaptic, Xeroform, and a splint.  The wound was closed and coursed with chromic suture of the 5-0 variety.  Following this, he was taken to recovery room.  He is in stable condition.  He will see [place] back in the office in 2 weeks, notify us should any problems occur.  I will place him in a removable orthosis and we will pre-make in the office and we will plan for gentle interval range of motion.  We discussed the relevant issues, do's and don'ts.  All questions have been encouraged and answered.  Should any problems occur, I will be immediately available.  This was an uncomplicated surgical procedure.  I am going to discharge him home on Bactrim DS as well as oxycodone.     Dionne Ano. Amanda Pea, M.D.     [facility]  D:  12/03/2015  T:  12/03/2015  Job:  [id number]

## 2016-02-04 ENCOUNTER — Other Ambulatory Visit: Payer: Self-pay | Admitting: Pulmonary Disease

## 2016-02-06 ENCOUNTER — Telehealth: Payer: Self-pay | Admitting: Pulmonary Disease

## 2016-02-06 NOTE — Telephone Encounter (Signed)
lmtcb x1 for pt's wife. 

## 2016-02-07 NOTE — Telephone Encounter (Signed)
lmtcb X2 for pt's wife with family member

## 2016-02-08 NOTE — Telephone Encounter (Signed)
LMOM TCB x3 for spouse Per pt's chart, the Atenolol was sent to the pharmacy on 4.24.17  Called spoke with patient who reported that he has already picked up the Atenolol and this was all that was needed.   Nothing further needed; will sign off.

## 2016-03-21 ENCOUNTER — Telehealth: Payer: Self-pay | Admitting: Pulmonary Disease

## 2016-03-21 MED ORDER — AMOXICILLIN-POT CLAVULANATE 875-125 MG PO TABS: 1.0000 | ORAL_TABLET | Freq: Two times a day (BID) | ORAL | Status: DC

## 2016-03-21 NOTE — Telephone Encounter (Signed)
I called and spoke with the pt's spouse and notified of recs per SN  She verbalized understanding  Rx sent to pharm

## 2016-03-21 NOTE — Telephone Encounter (Signed)
Attempted to call pt's wife. Voicemail is full. Will try back.

## 2016-03-21 NOTE — Telephone Encounter (Signed)
Patient wife called back wanting to check status. Advised that we tried to call and left message, pt states no missed call. She would like a call back [phone]. - prm

## 2016-03-21 NOTE — Telephone Encounter (Signed)
Spoke with pt's wife. States that pt has a sinus infection. Reports sinus congestion, sinus pressure, cough. Cough is producing dark green mucus. Denies chest tightness, wheezing, SOB or fever. Onset of symtptoms was 3 days ago. Would like an antibiotic called in.  Allergies  Allergen Reactions  . Metformin     REACTION: causes nausea    SN - please advise.

## 2016-03-21 NOTE — Telephone Encounter (Signed)
Per SN >> Augmentin 875 1po BID #20, Align probiotic, Mucinex 1200mg  BID, increase fluids, use nasal saline.  lmtcb x1 for pt's wife.

## 2016-05-07 ENCOUNTER — Other Ambulatory Visit: Payer: Self-pay | Admitting: Pulmonary Disease

## 2016-05-17 ENCOUNTER — Other Ambulatory Visit: Payer: Self-pay | Admitting: Pulmonary Disease

## 2016-06-19 ENCOUNTER — Other Ambulatory Visit: Payer: Self-pay | Admitting: Pulmonary Disease

## 2016-07-29 ENCOUNTER — Other Ambulatory Visit: Payer: Self-pay | Admitting: Pulmonary Disease

## 2016-09-19 ENCOUNTER — Telehealth: Payer: Self-pay | Admitting: Pulmonary Disease

## 2016-09-19 NOTE — Telephone Encounter (Signed)
Forwarding to Leigh to follow up on.

## 2016-09-21 NOTE — Telephone Encounter (Signed)
Form has been completed and placed on SN cart to be signed.  Will send in once completed.

## 2016-09-26 NOTE — Telephone Encounter (Signed)
Leigh, have these formed be sent?

## 2016-09-27 NOTE — Telephone Encounter (Signed)
Forms have been completed.  A copy has been mailed to the pt and a copy placed in the scan folder.

## 2016-10-03 ENCOUNTER — Telehealth: Payer: Self-pay | Admitting: Pulmonary Disease

## 2016-10-03 NOTE — Telephone Encounter (Signed)
Spoke with pt's wife since he was still at work. 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   Dr. Kriste BasqueNadel, is it ok to send this RX in? Thanks!

## 2016-10-04 ENCOUNTER — Other Ambulatory Visit: Payer: Self-pay

## 2016-10-04 MED ORDER — ALOGLIPTIN-PIOGLITAZONE 25-30 MG PO TABS: 1.0000 | ORAL_TABLET | Freq: Every day | ORAL | 0 refills | Status: DC

## 2016-10-04 NOTE — Telephone Encounter (Signed)
Spoke with pt.'s wife and sent in the rx as they requested that they needed to her pharmacy of choice. Nothing further is needed at this time.

## 2016-10-04 NOTE — Telephone Encounter (Signed)
Wife called back, waiting to hear back about medication.Charm RingsErica R Thomas

## 2016-10-04 NOTE — Telephone Encounter (Signed)
Per SN---  Ok to refill as needed for the pt.  thanks

## 2016-10-04 NOTE — Telephone Encounter (Signed)
Spoke with wife and informed her that the message was sent to [facility] but we have not heard anything as of yet. Will follow up with her once we hear from him. Nothing further is needed. Will await his response

## 2016-10-24 ENCOUNTER — Other Ambulatory Visit: Payer: Self-pay | Admitting: Pulmonary Disease

## 2016-10-24 ENCOUNTER — Telehealth: Payer: Self-pay | Admitting: Pulmonary Disease

## 2016-10-24 MED ORDER — LOSARTAN POTASSIUM 100 MG PO TABS: ORAL_TABLET | ORAL | 0 refills | Status: DC

## 2016-10-24 MED ORDER — OMEPRAZOLE 20 MG PO CPDR: 20.0000 mg | DELAYED_RELEASE_CAPSULE | Freq: Every day | ORAL | 0 refills | Status: DC

## 2016-10-24 NOTE — Telephone Encounter (Signed)
Pt wife states these forms were never faxed to [employer], she has original copies at home but forms were scanned into pt chart.  Per previous phone message these forms were never faxed to [employer].  Forms have since been faxed.  Nothing further needed at this time.

## 2016-10-24 NOTE — Telephone Encounter (Signed)
Pt wife requesting refills on omeprazole and losartan.  These have been sent 30 day supply.  Advised pt would need ov for future refills.  Pt wife states she will call back to schedule, cannot schedule at this time.  Nothing further needed.

## 2016-11-22 ENCOUNTER — Other Ambulatory Visit: Payer: Self-pay | Admitting: Pulmonary Disease

## 2016-11-22 MED ORDER — ALOGLIPTIN-PIOGLITAZONE 25-30 MG PO TABS: 1.0000 | ORAL_TABLET | Freq: Every day | ORAL | 3 refills | Status: DC

## 2016-11-23 ENCOUNTER — Other Ambulatory Visit: Payer: Self-pay | Admitting: Pulmonary Disease

## 2016-11-23 ENCOUNTER — Telehealth: Payer: Self-pay | Admitting: Pulmonary Disease

## 2016-11-23 MED ORDER — ALOGLIPTIN-PIOGLITAZONE 25-30 MG PO TABS: 1.0000 | ORAL_TABLET | Freq: Every day | ORAL | 0 refills | Status: DC

## 2016-11-23 NOTE — Telephone Encounter (Signed)
Called and spoke with pts wife and she stated that the pt will need a few tablets of the oseni to last until he can get the shipment from the pt assistance program. The are in the process of mailing this out to him and he will be out.  Nothing further is needed.

## 2016-11-30 ENCOUNTER — Other Ambulatory Visit: Payer: Self-pay | Admitting: Pulmonary Disease

## 2016-12-13 ENCOUNTER — Other Ambulatory Visit: Payer: Self-pay | Admitting: Pulmonary Disease

## 2016-12-13 ENCOUNTER — Telehealth: Payer: Self-pay | Admitting: Pulmonary Disease

## 2016-12-13 MED ORDER — OMEPRAZOLE 20 MG PO CPDR: 20.0000 mg | DELAYED_RELEASE_CAPSULE | Freq: Every day | ORAL | 0 refills | Status: DC

## 2016-12-13 MED ORDER — LOSARTAN POTASSIUM 100 MG PO TABS: ORAL_TABLET | ORAL | 0 refills | Status: DC

## 2016-12-13 NOTE — Telephone Encounter (Signed)
Spoke with pt's wife. Pt is needing refills on Losartan and Omeprazole. Advised her that pt needed an OV with SN. This has been scheduled for 12/20/16 at 9am. Rxs have been sent in. Nothing further was needed.

## 2016-12-20 ENCOUNTER — Ambulatory Visit: Payer: Self-pay | Admitting: Pulmonary Disease

## 2017-01-01 ENCOUNTER — Ambulatory Visit: Payer: Self-pay | Admitting: Pulmonary Disease

## 2017-01-21 ENCOUNTER — Other Ambulatory Visit: Payer: Self-pay | Admitting: Pulmonary Disease

## 2017-02-08 ENCOUNTER — Telehealth: Payer: Self-pay | Admitting: Pulmonary Disease

## 2017-02-08 MED ORDER — LOSARTAN POTASSIUM 100 MG PO TABS: ORAL_TABLET | ORAL | 0 refills | Status: DC

## 2017-02-08 NOTE — Telephone Encounter (Signed)
Called and spoke with pts wife and she is aware of refill that has been sent to the pharmacy.  She stated that he will be here for his appt on  Monday.

## 2017-02-11 ENCOUNTER — Encounter: Payer: Self-pay | Admitting: Pulmonary Disease

## 2017-02-11 ENCOUNTER — Ambulatory Visit (INDEPENDENT_AMBULATORY_CARE_PROVIDER_SITE_OTHER): Payer: Self-pay | Admitting: Pulmonary Disease

## 2017-02-11 ENCOUNTER — Other Ambulatory Visit (INDEPENDENT_AMBULATORY_CARE_PROVIDER_SITE_OTHER): Payer: Self-pay

## 2017-02-11 VITALS — BP 140/88 | HR 51 | Temp 97.8°F | Ht >= 80 in | Wt 295.1 lb

## 2017-02-11 DIAGNOSIS — I10 Essential (primary) hypertension: Secondary | ICD-10-CM

## 2017-02-11 DIAGNOSIS — E7800 Pure hypercholesterolemia, unspecified: Secondary | ICD-10-CM

## 2017-02-11 DIAGNOSIS — Z0000 Encounter for general adult medical examination without abnormal findings: Secondary | ICD-10-CM

## 2017-02-11 DIAGNOSIS — F411 Generalized anxiety disorder: Secondary | ICD-10-CM

## 2017-02-11 DIAGNOSIS — E119 Type 2 diabetes mellitus without complications: Secondary | ICD-10-CM

## 2017-02-11 DIAGNOSIS — E663 Overweight: Secondary | ICD-10-CM

## 2017-02-11 LAB — COMPREHENSIVE METABOLIC PANEL
ALT: 24 U/L (ref 0–53)
AST: 14 U/L (ref 0–37)
Albumin: 4.2 g/dL (ref 3.5–5.2)
Alkaline Phosphatase: 51 U/L (ref 39–117)
BILIRUBIN TOTAL: 0.7 mg/dL (ref 0.2–1.2)
BUN: 10 mg/dL (ref 6–23)
CO2: 29 meq/L (ref 19–32)
CREATININE: 0.89 mg/dL (ref 0.40–1.50)
Calcium: 9.6 mg/dL (ref 8.4–10.5)
Chloride: 107 mEq/L (ref 96–112)
GFR: 124.19 mL/min (ref >60.00)
GLUCOSE: 109 mg/dL — AB (ref 70–99)
Potassium: 4.8 mEq/L (ref 3.5–5.1)
Sodium: 140 mEq/L (ref 135–145)
Total Protein: 6.9 g/dL (ref 6.0–8.3)

## 2017-02-11 LAB — CBC WITH DIFFERENTIAL/PLATELET
BASOS ABS: 0 10*3/uL (ref 0.0–0.1)
Basophils Relative: 0.7 % (ref 0.0–3.0)
EOS ABS: 0.1 10*3/uL (ref 0.0–0.7)
Eosinophils Relative: 3.3 % (ref 0.0–5.0)
HEMATOCRIT: 44.4 % (ref 39.0–52.0)
Hemoglobin: 14.8 g/dL (ref 13.0–17.0)
LYMPHS PCT: 38.3 % (ref 12.0–46.0)
Lymphs Abs: 1.6 10*3/uL (ref 0.7–4.0)
MCHC: 33.5 g/dL (ref 30.0–36.0)
MCV: 88.1 fl (ref 78.0–100.0)
MONO ABS: 0.4 10*3/uL (ref 0.1–1.0)
Monocytes Relative: 11 % (ref 3.0–12.0)
Neutro Abs: 1.9 10*3/uL (ref 1.4–7.7)
Neutrophils Relative %: 46.7 % (ref 43.0–77.0)
PLATELETS: 214 10*3/uL (ref 150.0–400.0)
RBC: 5.04 Mil/uL (ref 4.22–5.81)
RDW: 14.3 % (ref 11.5–15.5)
WBC: 4.1 10*3/uL (ref 4.0–10.5)

## 2017-02-11 LAB — LIPID PANEL
CHOL/HDL RATIO: 4
Cholesterol: 160 mg/dL (ref 0–200)
HDL: 38.7 mg/dL — AB (ref >39.00)
LDL Cholesterol: 110 mg/dL — ABNORMAL HIGH (ref 0–99)
NONHDL: 121.29
Triglycerides: 57 mg/dL (ref 0.0–149.0)
VLDL: 11.4 mg/dL (ref 0.0–40.0)

## 2017-02-11 LAB — HEMOGLOBIN A1C: HEMOGLOBIN A1C: 6.7 % — AB (ref 4.6–6.5)

## 2017-02-11 LAB — TSH: TSH: 0.93 u[IU]/mL (ref 0.35–4.50)

## 2017-02-11 MED ORDER — ALOGLIPTIN-PIOGLITAZONE 25-30 MG PO TABS: 1.0000 | ORAL_TABLET | Freq: Every day | ORAL | 3 refills | Status: DC

## 2017-02-11 MED ORDER — OMEPRAZOLE 20 MG PO CPDR: 20.0000 mg | DELAYED_RELEASE_CAPSULE | Freq: Every day | ORAL | 3 refills | Status: DC

## 2017-02-11 MED ORDER — LOSARTAN POTASSIUM 100 MG PO TABS: ORAL_TABLET | ORAL | 3 refills | Status: DC

## 2017-02-11 MED ORDER — SERTRALINE HCL 100 MG PO TABS: 100.0000 mg | ORAL_TABLET | Freq: Every day | ORAL | 3 refills | Status: DC

## 2017-02-11 MED ORDER — ATENOLOL 100 MG PO TABS: 100.0000 mg | ORAL_TABLET | Freq: Every day | ORAL | 3 refills | Status: DC

## 2017-02-11 MED ORDER — GLIMEPIRIDE 4 MG PO TABS: ORAL_TABLET | ORAL | 3 refills | Status: DC

## 2017-02-11 MED ORDER — ALPRAZOLAM 0.25 MG PO TABS: 0.2500 mg | ORAL_TABLET | Freq: Every day | ORAL | 1 refills | Status: DC | PRN

## 2017-02-11 NOTE — Patient Instructions (Signed)
Today we updated your med list in our EPIC system...    Continue your current medications the same...  We refilled your meds per request...  Today we checked your follow up FASTING blood work...    We will contact you w/ the results when available...   Keep up the good job w/ weight reduction...  At 6'10" Tall your weight of 295 lbs is a BMI of 31...    280# is a BMI of 29    250# is a BMI of 26    Normal weight (BMI<25) is a weight under 240 lbs  Call for any questions...  Let's plan a follow up visit in 25yr, sooner if needed for problems.Marland KitchenMarland Kitchen
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
## 2017-02-15 NOTE — Progress Notes (Signed)
Called and spoke to pt. Informed him of the results and recs per SN. Pt verbalized understanding and denied any further questions or concerns at this time.  

## 2017-03-12 ENCOUNTER — Telehealth: Payer: Self-pay | Admitting: Pulmonary Disease

## 2017-03-12 MED ORDER — AZITHROMYCIN 250 MG PO TABS: ORAL_TABLET | ORAL | 1 refills | Status: DC

## 2017-03-12 NOTE — Telephone Encounter (Signed)
Spoke with pt's wife, advised we were calling in zpak.  This has been called in to correct pharmacy on pharmacy voicemail.  Nothing further needed.

## 2017-03-12 NOTE — Telephone Encounter (Signed)
Per SN---  zpak  With 1 refill to use as directed. I have called and spoke with pts wife and she is aware of rx that has been sent to the pharmacy.

## 2017-03-12 NOTE — Telephone Encounter (Signed)
Pt c/o increased sinus congestion, pnd, chills, intermittent low grade temp X1 week.  Mucus is green/yellow/brown.  Temp is going between 98-100. Has been taking benadryl and zyrtec to help with symptoms- requesting a zpak.   [facility] [address]    Last ov:02/11/17 Next ov:02/11/18 SN please advise.  Thanks!

## 2017-04-16 ENCOUNTER — Telehealth: Payer: Self-pay | Admitting: Pulmonary Disease

## 2017-04-16 NOTE — Telephone Encounter (Signed)
LM x 1 

## 2017-04-16 NOTE — Telephone Encounter (Signed)
SN  Please Advise-  Pt's wife called in on behalf of the pt and stated that pt's xanax and sertraline does not seem to be working for the patient any more. She states pt is having an increasing amount of panic attacks and also he has been dealing with a lot more stress due to family situations. Pt does not know what he should do. He thinks he may need to see a therapist to discuss his anxiety and depression. Wife states pt's bp has no increased. They are concerned because they do not have insurance and have to pay for the OV out of pocket.

## 2017-04-16 NOTE — Telephone Encounter (Signed)
Pt wife returning call.Scott Thomas and can be reached @ [phone].[name]

## 2017-04-18 MED ORDER — AMOXICILLIN-POT CLAVULANATE 875-125 MG PO TABS: 1.0000 | ORAL_TABLET | Freq: Two times a day (BID) | ORAL | 0 refills | Status: DC

## 2017-04-18 NOTE — Telephone Encounter (Signed)
Per SN---  If not PCN allergic---ok to call in augmentin 875 mg  #14  1 po bid Nasal saline spray otc flonase qhs  If not better will need OV with SN.  thanks

## 2017-04-18 NOTE — Telephone Encounter (Signed)
Spoke with pt's wife and informed her of SN's message. She understood and agreed to the rx being called in for the pt. Pharmacy verified and rx was sent. She had no further questions. Nothing further is needed.

## 2017-04-18 NOTE — Telephone Encounter (Signed)
Per SN---  SN agrees and stated that he would not get better with more meds.  He will need counseling and we can refer him over to Dr. Dellia CloudGutterman ASAP.  Pt will need the number to call as we cannot call and set up these appts. Thanks

## 2017-04-18 NOTE — Telephone Encounter (Signed)
lmom tcb x1 to wife  

## 2017-04-18 NOTE — Telephone Encounter (Signed)
Patient's wife is returning phone call.  °

## 2017-04-18 NOTE — Telephone Encounter (Signed)
I have spoken with pt's wife (EC) and made her aware of SN's recommendations. Scott ShipperOmniya has been provided with Dr. Dawayne CirriGutterman's phone number. Scott ShipperOmniya also states pt was prescribed a zpak on 03/12/17 with mild improvement. Scott Thomas states pt is experiencing  sinus pressure, headache and nasal drainage green in color [id number] Pt taken zyrtec with mild improvement.  SN please advise. Thanks.

## 2017-05-17 ENCOUNTER — Other Ambulatory Visit: Payer: Self-pay | Admitting: Pulmonary Disease

## 2017-05-17 NOTE — Telephone Encounter (Signed)
SN pts wife is calling and stating that the pt is currently taking the xanax 0.25 and this is not helping and they wanted to see if you would be willing to increase the dose to the xanax 0.5.  Please advise thanks

## 2017-05-20 ENCOUNTER — Telehealth: Payer: Self-pay | Admitting: Pulmonary Disease

## 2017-05-20 MED ORDER — ALPRAZOLAM 0.5 MG PO TABS: ORAL_TABLET | ORAL | 5 refills | Status: DC

## 2017-05-20 NOTE — Telephone Encounter (Signed)
LMTCB

## 2017-05-20 NOTE — Telephone Encounter (Signed)
Per SN--  Ok to change the xanax to the 0.5 mg   #90   Take 1/2 to 1 tablet by mouth TID prn nerves and refill x 5.  This has been called to his pharmacy and he and his wife are aware.

## 2017-05-21 MED ORDER — ALPRAZOLAM 0.5 MG PO TABS: ORAL_TABLET | ORAL | 5 refills | Status: DC

## 2017-05-21 NOTE — Telephone Encounter (Signed)
Pt wife states that we sent the patient's Xanax to the wrong pharmacy - [facility] [address] [facility] [address] Wife is requesting that this be sent to the correct pharmacy.   [facility] [address] and cancelled the Rx and the called it into the correct pharmacy.  Nothing further needed.

## 2017-06-01 ENCOUNTER — Emergency Department (HOSPITAL_COMMUNITY): Payer: Self-pay

## 2017-06-01 ENCOUNTER — Encounter (HOSPITAL_COMMUNITY): Payer: Self-pay

## 2017-06-01 ENCOUNTER — Emergency Department (HOSPITAL_COMMUNITY)
Admission: EM | Admit: 2017-06-01 | Discharge: 2017-06-01 | Disposition: A | Discharge status: Home / Self Care | Payer: Self-pay | Attending: Emergency Medicine | Admitting: Emergency Medicine

## 2017-06-01 DIAGNOSIS — Y92009 Unspecified place in unspecified non-institutional (private) residence as the place of occurrence of the external cause: Secondary | ICD-10-CM | POA: Insufficient documentation

## 2017-06-01 DIAGNOSIS — S92002A Unspecified fracture of left calcaneus, initial encounter for closed fracture: Secondary | ICD-10-CM | POA: Insufficient documentation

## 2017-06-01 DIAGNOSIS — E119 Type 2 diabetes mellitus without complications: Secondary | ICD-10-CM | POA: Insufficient documentation

## 2017-06-01 DIAGNOSIS — Z79899 Other long term (current) drug therapy: Secondary | ICD-10-CM | POA: Insufficient documentation

## 2017-06-01 DIAGNOSIS — Z87891 Personal history of nicotine dependence: Secondary | ICD-10-CM | POA: Insufficient documentation

## 2017-06-01 DIAGNOSIS — Y999 Unspecified external cause status: Secondary | ICD-10-CM | POA: Insufficient documentation

## 2017-06-01 DIAGNOSIS — Y939 Activity, unspecified: Secondary | ICD-10-CM | POA: Insufficient documentation

## 2017-06-01 DIAGNOSIS — I10 Essential (primary) hypertension: Secondary | ICD-10-CM | POA: Insufficient documentation

## 2017-06-01 DIAGNOSIS — X500XXA Overexertion from strenuous movement or load, initial encounter: Secondary | ICD-10-CM | POA: Insufficient documentation

## 2017-06-01 MED ORDER — OXYCODONE-ACETAMINOPHEN 5-325 MG PO TABS
1.0000 | ORAL_TABLET | Freq: Once | ORAL | Status: AC
  Administered 2017-06-01: 1 via ORAL
  Filled 2017-06-01: qty 1

## 2017-06-01 MED ORDER — HYDROCODONE-ACETAMINOPHEN 5-325 MG PO TABS: 1.0000 | ORAL_TABLET | ORAL | 0 refills | Status: DC | PRN

## 2017-06-01 MED ORDER — DICLOFENAC SODIUM 50 MG PO TBEC: 50.0000 mg | DELAYED_RELEASE_TABLET | Freq: Two times a day (BID) | ORAL | 0 refills | Status: DC

## 2017-06-01 NOTE — ED Triage Notes (Signed)
Patient reports that he stepped into a hole 2 days ago and twisted his left ankle. Patient has left ankle swelling and pain especially with weight bearing.

## 2017-06-01 NOTE — Discharge Instructions (Signed)
Do not take the narcotic pain medication if you are driving as it will make you sleepy. Call Dr. Ophelia Charter' office for a follow up appointment.

## 2017-06-01 NOTE — ED Notes (Signed)
Pt declined crutches.

## 2017-06-01 NOTE — ED Provider Notes (Signed)
[facility]-EMERGENCY DEPT Provider Note   CSN: [id number] Arrival date & time: 06/01/17  1758  By signing my name below, I, Deland Pretty, attest that this documentation has been prepared under the direction and in the presence of Kerrie Buffalo, NP Electronically Signed: Deland Pretty, ED Scribe. 06/01/17. 7:12 PM.  History   Chief Complaint Chief Complaint  Patient presents with  . Ankle Pain   The history is provided by the patient. No language interpreter was used.  Ankle Pain   The incident occurred 2 days ago. The incident occurred at home. Injury mechanism: twisting. The pain is present in the left ankle. The pain is moderate. The pain has been constant since onset. Pertinent negatives include no numbness. He reports no foreign bodies present. The symptoms are aggravated by bearing weight. He has tried elevation and ice for the symptoms.   HPI Comments: Scott Thomas is a 36 y.o. male who presents to the Emergency Department complaining of sudden onset of gradually worsening, constant left ankle/foot pain s/p an injury that occurred two days ago. He states that he stepped into a hole prior to the onset of his symptoms. The pt has tried applying cool compresses and elevation with inadequate relief. The pt reportes exacerbation of his pain. The pt has ah/x of anxiety, GERD, and HA. He denies fever and numbness.   Past Medical History:  Diagnosis Date  . Anxiety   . Diabetes mellitus   . GERD (gastroesophageal reflux disease)   . Headache    tension headaches  . Hypertension     Patient Active Problem List   Diagnosis Date Noted  . Physical exam, annual 02/11/2017  . Smoker 03/11/2013  . Abdominal pain, other specified site 03/10/2013  . Diabetes mellitus type 2, uncomplicated (HCC) 12/09/2010  . SINUSITIS, ACUTE 11/14/2009  . BACK STRAIN 07/18/2009  . ALLERGIC RHINITIS 04/05/2009  . CERVICAL STRAIN 03/15/2009  . Anxiety state 03/01/2009  . UPPER RESPIRATORY INFECTION  11/23/2008  . GERD 09/24/2008  . Hypercholesterolemia 05/07/2008  . Overweight 05/07/2008  . OTHER NONSPECIFIC ABNORMAL SERUM ENZYME LEVELS 05/07/2008  . Essential hypertension 03/17/2008  . BACK PAIN 03/17/2008    Past Surgical History:  Procedure Laterality Date  . CLOSED REDUCTION FINGER WITH PERCUTANEOUS PINNING Right 12/03/2015   Procedure: ORIF RIGHT RING FINGER PROXIMAL PHALANX WITH REPAIR RECONSTRUCTION ;  Surgeon: Dominica Severin, MD;  Location: [facility] OR;  Service: Orthopedics;  Laterality: Right;  Ring  . WISDOM TOOTH EXTRACTION         Home Medications    Prior to Admission medications   Medication Sig Start Date End Date Taking? Authorizing Provider  Alogliptin-Pioglitazone ([name]) 25-30 MG TABS Take 1 tablet by mouth daily. 02/11/17   Michele Mcalpine, MD  ALPRAZolam Prudy Feeler) 0.5 MG tablet Take 1/2 to 1 tablet by mouth three times daily as needed for nerves 05/21/17   Michele Mcalpine, MD  amoxicillin-clavulanate (AUGMENTIN) 875-125 MG tablet Take 1 tablet by mouth 2 (two) times daily. 04/18/17   Michele Mcalpine, MD  atenolol (TENORMIN) 100 MG tablet Take 1 tablet (100 mg total) by mouth daily. 02/11/17   Michele Mcalpine, MD  azithromycin (ZITHROMAX) 250 MG tablet Take as directed 03/12/17   Michele Mcalpine, MD  diclofenac (VOLTAREN) 50 MG EC tablet Take 1 tablet (50 mg total) by mouth 2 (two) times daily. 06/01/17   Janne Napoleon, NP  glimepiride (AMARYL) 4 MG tablet TAKE ONE TABLET BY MOUTH ONCE DAILY BEFORE BREAKFAST 02/11/17  Michele Mcalpine, MD  HYDROcodone-acetaminophen (NORCO/VICODIN) 5-325 MG tablet Take 1 tablet by mouth every 4 (four) hours as needed. 06/01/17   Janne Napoleon, NP  losartan (COZAAR) 100 MG tablet TAKE ONE TABLET BY MOUTH ONCE DAILY. 02/11/17   Michele Mcalpine, MD  omeprazole (PRILOSEC) 20 MG capsule Take 1 capsule (20 mg total) by mouth daily. 02/11/17   Michele Mcalpine, MD  sertraline (ZOLOFT) 100 MG tablet Take 1 tablet (100 mg total) by mouth daily. 02/11/17   Michele Mcalpine, MD    Family History Family History  Problem Relation Age of Onset  . Diabetes type II Sister   . Hypertension Mother   . Diabetes type II Mother   . Hypertension Father     Social History Social History  Substance Use Topics  . Smoking status: Former Smoker    Packs/day: 0.50    Years: 7.00    Types: Cigarettes  . Smokeless tobacco: Never Used     Comment: started smoking at age 20.  Marland Kitchen Alcohol use No     Allergies   Metformin   Review of Systems Review of Systems  Constitutional: Negative for fever.  HENT: Negative.   Gastrointestinal: Negative for nausea.  Musculoskeletal: Positive for arthralgias.  Skin: Negative for wound.  Neurological: Negative for numbness.     Physical Exam Updated Vital Signs BP (!) 166/90 (BP Location: Right Arm)   Pulse 65   Temp 97.7 F (36.5 C) (Oral)   Resp 16   Ht 6\' 10"  (2.083 m)   Wt 129.3 kg (285 lb)   SpO2 100%   BMI 29.80 kg/m   Physical Exam  Constitutional: He is oriented to person, place, and time. He appears well-developed and well-nourished. No distress.  HENT:  Head: Normocephalic.  Eyes: EOM are normal.  Neck: Normal range of motion.  Cardiovascular: Normal rate.   Pedal pulses 2+. Adequate circulation.  Pulmonary/Chest: Effort normal.  Musculoskeletal: He exhibits tenderness.  Tenderness to lateral aspect of left ankle that radiates to the foot. Pain with ROM. Plantar flexion and Dorsiflexion without difficulty.   Neurological: He is alert and oriented to person, place, and time.  Skin: Skin is warm and dry.  Psychiatric: He has a normal mood and affect.  Nursing note and vitals reviewed.    ED Treatments / Results   DIAGNOSTIC STUDIES: Oxygen Saturation is 100% on RA, normal by my interpretation.   COORDINATION OF CARE: 7:03 PM-Discussed next steps with pt. Pt verbalized understanding and is agreeable with the plan.   Labs (all labs ordered are listed, but only abnormal results  are displayed) Labs Reviewed - No data to display  Radiology Dg Ankle Complete Left  Result Date: 06/01/2017 CLINICAL DATA:  Stepped in a hole, lateral pain EXAM: LEFT ANKLE COMPLETE - 3+ VIEW COMPARISON:  06/11/2010 FINDINGS: No malalignment. Questionable lucency anterior calcaneus on lateral view. Ankle mortise symmetric. Ossicle and degenerative change medially, similar compared to prior. Moderate plantar calcaneal spur. IMPRESSION: 1. Questionable lucency/fracture within the anterior inferior calcaneus on lateral view 2. No acute fracture or malalignment at the ankle Electronically Signed   By: Jasmine Pang M.D.   On: 06/01/2017 18:49    Procedures Procedures (including critical care time)  Medications Ordered in ED Medications  oxyCODONE-acetaminophen (PERCOCET/ROXICET) 5-325 MG per tablet 1 tablet (not administered)     Initial Impression / Assessment and Plan / ED Course  I have reviewed the triage vital signs and the nursing  notes.  36 y.o. male with left foot/ankle pain stable for d/c without focal neuro deficits. X-ray shows a fracture of the calcaneus. Patient given cam walker and crutches. He will apply ice and call the orthopedic doctor for follow up. Pain managed in the ED.   Final Clinical Impressions(s) / ED Diagnoses   Final diagnoses:  Closed nondisplaced fracture of left calcaneus, unspecified portion of calcaneus, initial encounter    New Prescriptions New Prescriptions   DICLOFENAC (VOLTAREN) 50 MG EC TABLET    Take 1 tablet (50 mg total) by mouth 2 (two) times daily.   HYDROCODONE-ACETAMINOPHEN (NORCO/VICODIN) 5-325 MG TABLET    Take 1 tablet by mouth every 4 (four) hours as needed.  I personally performed the services described in this documentation, which was scribed in my presence. The recorded information has been reviewed and is accurate.    Kerrie Buffalo [place], [place] 06/01/17 1610    Geoffery Lyons, MD 06/01/17 [phone]

## 2017-08-26 ENCOUNTER — Other Ambulatory Visit: Payer: Self-pay | Admitting: Orthopedic Surgery

## 2017-08-26 DIAGNOSIS — S92002A Unspecified fracture of left calcaneus, initial encounter for closed fracture: Secondary | ICD-10-CM

## 2017-08-26 DIAGNOSIS — M79672 Pain in left foot: Secondary | ICD-10-CM

## 2017-08-29 ENCOUNTER — Other Ambulatory Visit: Payer: Self-pay

## 2017-09-04 ENCOUNTER — Other Ambulatory Visit: Payer: Self-pay

## 2017-10-01 ENCOUNTER — Other Ambulatory Visit: Payer: Self-pay | Admitting: *Deleted

## 2017-10-01 ENCOUNTER — Telehealth: Payer: Self-pay | Admitting: Pulmonary Disease

## 2017-10-01 MED ORDER — ALOGLIPTIN-PIOGLITAZONE 25-30 MG PO TABS: 1.0000 | ORAL_TABLET | Freq: Every day | ORAL | 3 refills | Status: DC

## 2017-10-02 NOTE — Telephone Encounter (Signed)
Spoke with pt's wife, Irene ShipperOmniya. She states that someone has already refilled the pt's medication. Nothing further was needed.

## 2017-10-03 ENCOUNTER — Telehealth: Payer: Self-pay | Admitting: Pulmonary Disease

## 2017-10-03 NOTE — Telephone Encounter (Signed)
LMTCB 12/20. Pt needs to be informed that we faxed his [name] paperwork back to the patient assistance program. Dr Kriste BasqueNadel also wants to inform him that he will need to establish with a new PCP before the end of next year based on the discussion from last visit.

## 2017-11-14 ENCOUNTER — Telehealth: Payer: Self-pay | Admitting: Pulmonary Disease

## 2017-11-14 NOTE — Telephone Encounter (Signed)
Attempted to call patient back to follow up on dentist recommendations. No answer, voicemail left.

## 2017-11-14 NOTE — Telephone Encounter (Signed)
Called patient:   He is currently at the dentist about to get a tooth pulled. He is experiencing frontal headaches, congestion, ear and teeth pain.   Will follow up with him after dentist appt to determine if he received an abx from dentist or not.

## 2017-11-14 NOTE — Telephone Encounter (Signed)
Called and spoke with pt and he stated that his sinus infection came back.  He is having a lot of sneezing and is wanting to have a abx called to the pharmacy.  SN please advise. Thanks  Allergies  Allergen Reactions  . Metformin     REACTION: causes nausea

## 2017-11-14 NOTE — Telephone Encounter (Signed)
Sierra, please advise if you were able to reach pt.  Thanks!

## 2017-11-15 MED ORDER — AMOXICILLIN-POT CLAVULANATE 875-125 MG PO TABS: 1.0000 | ORAL_TABLET | Freq: Two times a day (BID) | ORAL | 0 refills | Status: DC

## 2017-11-15 NOTE — Telephone Encounter (Signed)
Per SN:  augmentin 875mg  #14 BID

## 2018-01-09 ENCOUNTER — Encounter: Payer: Self-pay | Admitting: Pulmonary Disease

## 2018-01-09 ENCOUNTER — Other Ambulatory Visit (INDEPENDENT_AMBULATORY_CARE_PROVIDER_SITE_OTHER): Payer: Self-pay

## 2018-01-09 ENCOUNTER — Ambulatory Visit (INDEPENDENT_AMBULATORY_CARE_PROVIDER_SITE_OTHER): Payer: Self-pay | Admitting: Pulmonary Disease

## 2018-01-09 VITALS — BP 148/96 | HR 75 | Temp 97.7°F | Ht >= 80 in | Wt 313.6 lb

## 2018-01-09 DIAGNOSIS — R399 Unspecified symptoms and signs involving the genitourinary system: Secondary | ICD-10-CM

## 2018-01-09 DIAGNOSIS — I10 Essential (primary) hypertension: Secondary | ICD-10-CM

## 2018-01-09 DIAGNOSIS — E663 Overweight: Secondary | ICD-10-CM

## 2018-01-09 DIAGNOSIS — R350 Frequency of micturition: Secondary | ICD-10-CM

## 2018-01-09 DIAGNOSIS — E119 Type 2 diabetes mellitus without complications: Secondary | ICD-10-CM

## 2018-01-09 DIAGNOSIS — E7800 Pure hypercholesterolemia, unspecified: Secondary | ICD-10-CM

## 2018-01-09 DIAGNOSIS — F411 Generalized anxiety disorder: Secondary | ICD-10-CM

## 2018-01-09 LAB — BASIC METABOLIC PANEL
BUN: 9 mg/dL (ref 6–23)
CHLORIDE: 105 meq/L (ref 96–112)
CO2: 31 meq/L (ref 19–32)
Calcium: 9.3 mg/dL (ref 8.4–10.5)
Creatinine, Ser: 0.89 mg/dL (ref 0.40–1.50)
GFR: 123.57 mL/min (ref >60.00)
GLUCOSE: 144 mg/dL — AB (ref 70–99)
Potassium: 4.3 mEq/L (ref 3.5–5.1)
Sodium: 141 mEq/L (ref 135–145)

## 2018-01-09 LAB — URINALYSIS
Bilirubin Urine: NEGATIVE
Hgb urine dipstick: NEGATIVE
Ketones, ur: NEGATIVE
Leukocytes, UA: NEGATIVE
Nitrite: NEGATIVE
PH: 7 (ref 5.0–8.0)
SPECIFIC GRAVITY, URINE: 1.02 (ref 1.000–1.030)
TOTAL PROTEIN, URINE-UPE24: NEGATIVE
Urine Glucose: NEGATIVE
Urobilinogen, UA: 2 — AB (ref 0.0–1.0)

## 2018-01-09 LAB — HEMOGLOBIN A1C: Hgb A1c MFr Bld: 6.9 % — ABNORMAL HIGH (ref 4.6–6.5)

## 2018-01-09 MED ORDER — AMLODIPINE BESYLATE 5 MG PO TABS: 5.0000 mg | ORAL_TABLET | Freq: Every day | ORAL | 5 refills | Status: DC

## 2018-01-09 MED ORDER — CIPROFLOXACIN HCL 500 MG PO TABS: 500.0000 mg | ORAL_TABLET | Freq: Two times a day (BID) | ORAL | 0 refills | Status: DC
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
## 2018-01-09 NOTE — Patient Instructions (Signed)
Today we updated your med list in our EPIC system...    Continue your current medications the same...  We checked your diabetic labs and a urinalysis & culture...    We will contact you w/ the results when available...   Let's start CIPRO 500mg  one tab twice daily til gone for the Urinary infection...  We are adding AMLODIPINE 5mg  one tab daily for your BP...    you've got to get back on track w/ the LOW CARB, LOW SODIUM (no salt) diet and work on losing that 20 lbs!!!  Call for any questions...  Let's plan a follow up visit in 2-3 months to monitor your progress & recheck BP etc...Marland Kitchen

## 2018-01-10 LAB — URINE CULTURE
MICRO NUMBER:: 90388095
SPECIMEN QUALITY:: ADEQUATE

## 2018-01-13 ENCOUNTER — Telehealth: Payer: Self-pay | Admitting: Pulmonary Disease

## 2018-01-13 NOTE — Telephone Encounter (Signed)
Spoke with pt's wife. She was the one who originally called. She would like the results of the pt's lab work.  SN - please advise. Thanks.

## 2018-01-13 NOTE — Telephone Encounter (Signed)
I called pt w/ report- see lab section... SMN

## 2018-02-03 ENCOUNTER — Other Ambulatory Visit: Payer: Self-pay | Admitting: Pulmonary Disease

## 2018-02-04 ENCOUNTER — Telehealth: Payer: Self-pay | Admitting: Pulmonary Disease

## 2018-02-04 MED ORDER — ALPRAZOLAM 0.5 MG PO TABS
ORAL_TABLET | ORAL | 2 refills | Status: DC
Start: 2018-02-04 — End: 2019-05-27

## 2018-02-04 NOTE — Telephone Encounter (Signed)
Spoke with Patient's wife, Bary RichardOmniva, about Xanax refill.  She stated understanding and that she would let Casimiro NeedleMichael know.  Nothing further needed at this time.

## 2018-02-04 NOTE — Telephone Encounter (Signed)
Spoke with patient's wife. She stated that the patient needs a refill on his xanax 0.5mg . Patient is going to a funeral today and really needs this medication beforehand. He let the old RX expire.   Last OV: 01/09/18 Next OV: 04/14/18 Last Refill: 05/21/17 for 90 tablets and 5 refills  Dr. Kriste BasqueNadel, please advise if this refill will ok. Thanks!   [address] is [facility] [address]

## 2018-02-04 NOTE — Telephone Encounter (Signed)
Per SN ok to refill Xanax 0.5mg , take 1 PO every 8 hours as needed for anxiety,#90 with 2 refills.  Called in at preferred pharmacy, [facility] [address] Attempted to call Patient, but was unable to leave a message due to mailbox full.

## 2018-02-11 ENCOUNTER — Ambulatory Visit: Payer: Self-pay | Admitting: Pulmonary Disease

## 2018-02-13 ENCOUNTER — Other Ambulatory Visit: Payer: Self-pay | Admitting: Pulmonary Disease

## 2018-02-25 ENCOUNTER — Other Ambulatory Visit: Payer: Self-pay | Admitting: Pulmonary Disease

## 2018-02-26 ENCOUNTER — Other Ambulatory Visit: Payer: Self-pay

## 2018-02-26 MED ORDER — SERTRALINE HCL 100 MG PO TABS: 100.0000 mg | ORAL_TABLET | Freq: Every day | ORAL | 0 refills | Status: DC

## 2018-02-28 ENCOUNTER — Telehealth: Payer: Self-pay | Admitting: Pulmonary Disease

## 2018-02-28 MED ORDER — SERTRALINE HCL 100 MG PO TABS: 100.0000 mg | ORAL_TABLET | Freq: Every day | ORAL | 1 refills | Status: DC

## 2018-02-28 NOTE — Telephone Encounter (Signed)
Spoke with wife and she is requesting a refill of pt's Zoloft. I sent the Rx to the pharmacy and nothing further is needed.

## 2018-04-14 ENCOUNTER — Ambulatory Visit: Payer: Self-pay | Admitting: Pulmonary Disease

## 2018-04-22 ENCOUNTER — Ambulatory Visit (INDEPENDENT_AMBULATORY_CARE_PROVIDER_SITE_OTHER): Payer: Self-pay | Admitting: Pulmonary Disease

## 2018-04-22 ENCOUNTER — Encounter: Payer: Self-pay | Admitting: Pulmonary Disease

## 2018-04-22 VITALS — BP 152/90 | HR 66 | Temp 98.1°F | Ht >= 80 in | Wt 315.0 lb

## 2018-04-22 DIAGNOSIS — F411 Generalized anxiety disorder: Secondary | ICD-10-CM

## 2018-04-22 DIAGNOSIS — E119 Type 2 diabetes mellitus without complications: Secondary | ICD-10-CM

## 2018-04-22 DIAGNOSIS — I10 Essential (primary) hypertension: Secondary | ICD-10-CM

## 2018-04-22 DIAGNOSIS — E663 Overweight: Secondary | ICD-10-CM

## 2018-04-22 DIAGNOSIS — E7800 Pure hypercholesterolemia, unspecified: Secondary | ICD-10-CM

## 2018-04-22 MED ORDER — AZITHROMYCIN 250 MG PO TABS: ORAL_TABLET | ORAL | 1 refills | Status: DC

## 2018-04-22 MED ORDER — AMLODIPINE BESYLATE 5 MG PO TABS: 5.0000 mg | ORAL_TABLET | Freq: Every day | ORAL | 5 refills | Status: DC

## 2018-04-22 NOTE — Patient Instructions (Signed)
Today we updated your med list in our EPIC system...    Continue your current medications the same- including all 3 BP meds & the 2 DM meds...  Today we wrote for a ZPak to use for your sinus infection, and gave you a refill if needed...  OK to continue the Nasal Saline wash, and get the OTC FLONASE (Fluticasone) & take 2 sprays in each nostril at bedtime...  Remember-- no salt in diet!!!  Work on weight reduction...  Call for any questions...  Let's plan a follow up visit in [id number] (early AM, FASTING for blood work).Marland Kitchen..Marland Kitchen

## 2018-04-23 ENCOUNTER — Encounter: Payer: Self-pay | Admitting: Pulmonary Disease
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
## 2018-07-08 ENCOUNTER — Other Ambulatory Visit: Payer: Self-pay | Admitting: Pulmonary Disease

## 2018-09-22 ENCOUNTER — Ambulatory Visit: Payer: Self-pay | Admitting: Pulmonary Disease

## 2018-10-10 ENCOUNTER — Telehealth: Payer: Self-pay | Admitting: Pulmonary Disease

## 2018-10-10 NOTE — Telephone Encounter (Signed)
Called and spoke with pt who stated he has had problems of head congestion, having pain in his ears, and believes he has a sinus infection. When asked pt if he is having any discoloration in the drainage, all pt could say is that it was not real dark in color but he stated to me he gets this every year with all the same symptoms.  Pt denies any fever. Pt states symptoms have been going on x3-4 days now and wants to have something prescribed to help with symptoms.  Pt was scheduled for an appt with SN 09/22/18 but pt was a no show to this appt. I advised pt that we should get him to come in for an appt due to it being since July 2019 since he had been at the office. Pt stated to me that he was unable to come in for an appt today, 12/27 due to having to go to work.  Arlys JohnBrian, please advise on this for pt. Thanks!

## 2018-10-10 NOTE — Telephone Encounter (Signed)
Yes patient will need office visit for further evaluation.  Patient can start Flonase -1 spray each nostril daily, nasal saline rinses -every 4 hours as needed, and over-the-counter decongestant (such as Mucinex DM) to help manage symptoms in the meantime.  Patient symptom timeframe is only 3 to 4 days and he is afebrile which means this is most likely viral.  If patient's symptoms persist longer than 8 to 10 days, sputum color nasal drainage color changes, or fever and sinus pain worsens please present to an office visit for further evaluation.  If patient was a primary care patient for Dr. Kriste BasqueNadel then please refer the patient for primary care if they have not been established somewhere else.  Scott HeadlandBrian Denzil Mceachron FNP

## 2018-10-10 NOTE — Telephone Encounter (Signed)
Called and spoke with pt letting him know that we would need him to schedule an appt for further evaluation. I stated all the information per Elisha HeadlandBrian Mack and stated to him if symptoms did persist longer than 8 to 10 days or if symptoms worsened, then we would need to get him to come in for an OV for further evaluation.  Asked pt if SN was his PCP and pt state to me that he was. Pt has been made aware that SN is retiring and is no longer seeing pts. Asked pt if he has found another PCP and he stated that he has not. Stated to pt that he will need to find another PCP since SN is no longer seeing pts.  Pt stated to me he thought the last time the zpak was prescribed by SN there was one RF attached with that Rx. Pt stated to me he was going to call the pharmacy to see if there was one RF of the zpak and if so he would just get that filled if it was still avail at pharmacy. Nothing further needed.

## 2018-11-05 ENCOUNTER — Telehealth: Payer: Self-pay | Admitting: Pulmonary Disease

## 2018-11-05 NOTE — Telephone Encounter (Signed)
Patient left [employer] form for Dr. Charlie Pitter nurse Lisa//form was placed oi Dr. Jodelle Green box//fmb

## 2018-11-05 NOTE — Telephone Encounter (Signed)
Form signed by Dr. Kriste Basque. Called and spoke with [facility].  Made aware of forms ready for pick up. Forms placed in sealed envelope for pick up.  Per SN- recommended PCP follow up at [facility].  Patient has already scheduled PCP appointment with provider at [facility].  Nothing further at this time.

## 2018-11-05 NOTE — Telephone Encounter (Signed)
Patient left form for financila assitance for Dr. Jodelle Green nurse Lisa///forms placed in Dr. Jodelle Green box//fmb

## 2018-11-05 NOTE — Telephone Encounter (Signed)
Form signed by Dr. Nadel. Called and spoke with [employer].  Made aware of forms ready for pick up. Forms placed in sealed envelope for pick up.  Per SN- recommended PCP follow up at [facility].  Patient has already scheduled PCP appointment with provider at [facility].  Nothing further at this time. 

## 2018-11-05 NOTE — Telephone Encounter (Signed)
Called and spoke with [facility].  Patient has received his yearly medical assistance form.  Patient is a Dr. Kriste Basque Patient.  Instructed her to bring forms by the [employer] office this week, and will fill out , and have Dr. Kriste Basque sign.  Understanding stated.  Forms are being brought to me this afternoon.  Will hold in box until forms received and completed

## 2018-11-07 ENCOUNTER — Ambulatory Visit: Payer: Self-pay | Admitting: Family Medicine

## 2018-11-11 ENCOUNTER — Ambulatory Visit: Payer: Self-pay | Admitting: Family Medicine

## 2018-11-11 DIAGNOSIS — Z0289 Encounter for other administrative examinations: Secondary | ICD-10-CM

## 2018-12-05 ENCOUNTER — Telehealth: Payer: Self-pay | Admitting: Pulmonary Disease

## 2018-12-05 ENCOUNTER — Other Ambulatory Visit: Payer: Self-pay | Admitting: Pulmonary Disease

## 2018-12-05 NOTE — Telephone Encounter (Signed)
atc pt's spouse, someone answered but when I asked to speak to pt's wife the line was hung up.  atc back, was sent to vm.  Lmtcb.  This is a former SN pt- needs to get this med through his PCP.  Per BJT, if pt does not currently have a PCP or is awaiting an OV with his PCP we can fill until this visit, but this will need to be taken over by PCP.  [name] is a diabetes med.

## 2018-12-08 MED ORDER — ALOGLIPTIN-PIOGLITAZONE 25-30 MG PO TABS: 1.0000 | ORAL_TABLET | Freq: Every day | ORAL | 0 refills | Status: DC

## 2018-12-08 NOTE — Telephone Encounter (Signed)
ATC Patient.  Unable to leave message, VM is full, on [phone], and [phone], is busy signal.  Per Dr Kriste Basque, ok for refill to be sent to pharmacy.  Ofeni refill sent to requested pharmacy.  Will try contacting Patient at a later time.

## 2018-12-08 NOTE — Telephone Encounter (Signed)
LMTCB x2 for pt's wife.  

## 2018-12-08 NOTE — Telephone Encounter (Signed)
Pt's wife Irene Shipper returned call. I asked Irene Shipper if pt had another PCP picked out yet and she stated that pt had picked out a PCP at [facility] but he is unable to get an appt there until April.  Irene Shipper also stated that patient assistance application was filled out a couple weeks ago but somehow the application was lost and pt is going to have to have application refilled out. Irene Shipper is going to try to bring the application to office today for Misty Stanley T to refill out for pt so they can send it off again for the [facility] medication.  Due to pt being out of medication, which he has been out now for about 2 weeks, [employer] and pt are wanting to know if pt can have an Rx of about 10 pills to be sent in to pt's pharmacy to hold pt over until patient assistance paperwork can be received as the medication is too expensive. If 10 pills can be sent in, pt will take the med every other day to try to have the med last until pt can be able to get help with patient assistance.  Stated to [facility] that I was going to send this to [employer] T for her to review and then get back with her on it. Routing to [place].

## 2018-12-08 NOTE — Telephone Encounter (Signed)
Called and spoke with Patient's Wife.  Informed her of prescription being sent to requested pharmacy.  Understanding stated.  Nothing further at this time.

## 2018-12-09 ENCOUNTER — Telehealth: Payer: Self-pay | Admitting: Pulmonary Disease

## 2018-12-09 ENCOUNTER — Other Ambulatory Visit: Payer: Self-pay | Admitting: Pulmonary Disease

## 2018-12-09 NOTE — Telephone Encounter (Signed)
Former SN PCP. Pt has an appt scheduled for new PCP 01/14/2019.  Refill request received for xanax 0.5mg . pt last seen by SN 04/22/2018. Tammy, please advise if it is okay to refill med and if we can refill med under you since pt has not been able to see new PCP yet?

## 2018-12-09 NOTE — Telephone Encounter (Signed)
Per TP: sorry, we are unable to refill controlled medications without office visit (last seen 6 months ago).  Thanks.  Note sent to pharmacy with denial.

## 2018-12-09 NOTE — Telephone Encounter (Signed)
[name] Patient assistance form received. Scott Thomas stated current form  signed by Dr Kriste Basque was lost by company.  Patient is scheduled to see new PCP 01/14/19.  Will hold form until signed by Dr Kriste Basque.

## 2018-12-12 NOTE — Telephone Encounter (Signed)
Patient assistance form signed by Dr Kriste Basque.  Placed in sealed envelope at front desk for pick up.  Called and left message on Patient's VM that form was ready for pick up.  Nothing further at this time.

## 2019-01-07 ENCOUNTER — Other Ambulatory Visit: Payer: Self-pay | Admitting: Pulmonary Disease

## 2019-01-07 ENCOUNTER — Telehealth: Payer: Self-pay | Admitting: Pulmonary Disease

## 2019-01-07 NOTE — Telephone Encounter (Signed)
Per Misty Stanley T, this can refilled for a 30 day supply under Dr. Kriste Basque.  LMTCB x1 for pt's wife.

## 2019-01-08 NOTE — Telephone Encounter (Signed)
ATC pt's wife, no answer and her voicemail was full. Spoke with pt. He states that this was already taken care of. Nothing further was needed.

## 2019-01-14 ENCOUNTER — Ambulatory Visit: Payer: Self-pay | Admitting: Adult Health

## 2019-02-12 ENCOUNTER — Other Ambulatory Visit: Payer: Self-pay | Admitting: Pulmonary Disease

## 2019-03-06 ENCOUNTER — Other Ambulatory Visit: Payer: Self-pay | Admitting: Pulmonary Disease

## 2019-03-10 ENCOUNTER — Telehealth: Payer: Self-pay | Admitting: Pulmonary Disease

## 2019-03-10 NOTE — Telephone Encounter (Signed)
Pt is a former PCP pt of Dr. Kriste Basque and was last seen by [facility] 04/22/18. Due to SN being retired, meds will need to be refilled by pt's new PCP. We need to find out if pt has been established with a new PCP for this reason.  Attempted to call pt's wife Irene Shipper but unable to reach her. Left message for Irene Shipper to return call.

## 2019-03-11 NOTE — Telephone Encounter (Signed)
LMTCB x2 for pt's wife, Irene Shipper.

## 2019-03-11 NOTE — Telephone Encounter (Signed)
Returning phone call °

## 2019-03-11 NOTE — Telephone Encounter (Signed)
Made spouse aware pt would have to establish with pcp. Pt does not have insurance. 02/12/19 Amlodipine was refilled 12/08/18 [name] was refilled 01/07/19 Pt was supposed to be seen by a new pcp but COVID caused appt to be cancelled.  They have not found another pcp due to no insurance. Explained our physician's are pulmonologist and not pcp. Gave # to community health and wellness. Nothing further needed.

## 2019-04-29 ENCOUNTER — Ambulatory Visit: Payer: Self-pay | Admitting: Family Medicine

## 2019-05-27 ENCOUNTER — Encounter: Payer: Self-pay | Admitting: Family Medicine

## 2019-05-27 ENCOUNTER — Ambulatory Visit: Payer: Self-pay | Attending: Family Medicine | Admitting: Family Medicine

## 2019-05-27 ENCOUNTER — Other Ambulatory Visit: Payer: Self-pay

## 2019-05-27 DIAGNOSIS — F4320 Adjustment disorder, unspecified: Secondary | ICD-10-CM

## 2019-05-27 DIAGNOSIS — K219 Gastro-esophageal reflux disease without esophagitis: Secondary | ICD-10-CM

## 2019-05-27 DIAGNOSIS — E119 Type 2 diabetes mellitus without complications: Secondary | ICD-10-CM

## 2019-05-27 DIAGNOSIS — F410 Panic disorder [episodic paroxysmal anxiety] without agoraphobia: Secondary | ICD-10-CM

## 2019-05-27 DIAGNOSIS — F4321 Adjustment disorder with depressed mood: Secondary | ICD-10-CM

## 2019-05-27 DIAGNOSIS — F411 Generalized anxiety disorder: Secondary | ICD-10-CM

## 2019-05-27 DIAGNOSIS — I10 Essential (primary) hypertension: Secondary | ICD-10-CM

## 2019-05-27 MED ORDER — SERTRALINE HCL 100 MG PO TABS: 100.0000 mg | ORAL_TABLET | Freq: Every day | ORAL | 0 refills | Status: DC

## 2019-05-27 MED ORDER — ALOGLIPTIN-PIOGLITAZONE 25-30 MG PO TABS: 1.0000 | ORAL_TABLET | Freq: Every day | ORAL | 0 refills | Status: DC

## 2019-05-27 MED ORDER — GLIMEPIRIDE 4 MG PO TABS: ORAL_TABLET | ORAL | 0 refills | Status: DC

## 2019-05-27 MED ORDER — ATENOLOL 100 MG PO TABS: 100.0000 mg | ORAL_TABLET | Freq: Every day | ORAL | 3 refills | Status: DC

## 2019-05-27 MED ORDER — LOSARTAN POTASSIUM 100 MG PO TABS: ORAL_TABLET | ORAL | 0 refills | Status: DC

## 2019-05-27 MED ORDER — ALPRAZOLAM 0.5 MG PO TABS: ORAL_TABLET | ORAL | 0 refills | Status: DC

## 2019-05-27 MED ORDER — OMEPRAZOLE 20 MG PO CPDR: 20.0000 mg | DELAYED_RELEASE_CAPSULE | Freq: Every day | ORAL | 0 refills | Status: DC

## 2019-05-27 MED ORDER — AMLODIPINE BESYLATE 5 MG PO TABS: 5.0000 mg | ORAL_TABLET | Freq: Every day | ORAL | 0 refills | Status: DC
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
## 2019-05-27 NOTE — Progress Notes (Signed)
Med refills  88 was his blood sugar this morning before eatting  Per pt his little sister just passed away 2023/04/22.

## 2019-06-16 ENCOUNTER — Encounter: Payer: Self-pay | Admitting: Licensed Clinical Social Worker
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
## 2019-06-17 ENCOUNTER — Other Ambulatory Visit: Payer: Self-pay | Admitting: Family Medicine

## 2019-06-17 ENCOUNTER — Telehealth: Payer: Self-pay | Admitting: Licensed Clinical Social Worker

## 2019-06-17 MED ORDER — DOXYCYCLINE HYCLATE 100 MG PO TABS: 100.0000 mg | ORAL_TABLET | Freq: Two times a day (BID) | ORAL | 0 refills | Status: DC

## 2019-06-17 NOTE — Progress Notes (Signed)
Patient ID: Scott Thomas, male   DOB: [birth year], 38 y.o.   MRN: [id number]   Staff message received that patient was diagnosed with COVID-19 and requests an antibiotic but does not feel that azithromycin works for him. RX will be sent in for doxycycline

## 2019-06-17 NOTE — Telephone Encounter (Signed)
Call placed to patient. LCSW informed him requested medications have been sent to preferred pharmacy. Pt appreciative for the call. No additional concerns noted.

## 2019-06-25 ENCOUNTER — Ambulatory Visit: Payer: Self-pay | Admitting: Family Medicine

## 2019-07-01 ENCOUNTER — Telehealth: Payer: Self-pay | Admitting: Pulmonary Disease

## 2019-07-01 ENCOUNTER — Other Ambulatory Visit: Payer: Self-pay | Admitting: Family Medicine

## 2019-07-01 DIAGNOSIS — F410 Panic disorder [episodic paroxysmal anxiety] without agoraphobia: Secondary | ICD-10-CM

## 2019-07-01 DIAGNOSIS — F4320 Adjustment disorder, unspecified: Secondary | ICD-10-CM

## 2019-07-01 DIAGNOSIS — F411 Generalized anxiety disorder: Secondary | ICD-10-CM

## 2019-07-01 DIAGNOSIS — F4321 Adjustment disorder with depressed mood: Secondary | ICD-10-CM

## 2019-07-01 MED ORDER — ALPRAZOLAM 0.25 MG PO TABS: 0.2500 mg | ORAL_TABLET | Freq: Two times a day (BID) | ORAL | 0 refills | Status: DC | PRN

## 2019-07-01 NOTE — Telephone Encounter (Signed)
1) Medication(s) Requested (by name): -ALPRAZolam (XANAX) 0.5 MG tablet   2) Pharmacy of Choice: -[facility] [place]

## 2019-07-01 NOTE — Telephone Encounter (Signed)
Please notify patient that alprazolam was refilled at 0.5 mg twice daily as needed.  Patient should make follow-up appointment if future refill needed and patient had also been instructed to follow-up with mental health at his last visit

## 2019-07-01 NOTE — Progress Notes (Signed)
Patient ID: Scott Thomas, male   DOB: [birth year], 38 y.o.   MRN: [id number]   Phone message received that patient would like refill of alprazolam.  Prescription had previously been provided for alprazolam 0.5 mg as patient had acute grief/anxiety after the unexpected death of his sister the summer.  New prescription will be sent in for decreased dose of 0.5 mg as this was not meant to be a long-term medication and patient will need follow-up.  Patient had also been encouraged to seek mental health follow-up.

## 2019-07-03 NOTE — Telephone Encounter (Signed)
Spoke with patient and informed him with what provider stated. Patient saw [place] and he verbalized understanding. Patient made appt with provider

## 2019-07-23 ENCOUNTER — Ambulatory Visit: Payer: Self-pay | Admitting: Family Medicine

## 2019-07-23 ENCOUNTER — Other Ambulatory Visit: Payer: Self-pay

## 2019-08-10 ENCOUNTER — Ambulatory Visit: Payer: Self-pay | Attending: Family Medicine | Admitting: Critical Care Medicine

## 2019-08-10 ENCOUNTER — Encounter: Payer: Self-pay | Admitting: Critical Care Medicine

## 2019-08-10 ENCOUNTER — Other Ambulatory Visit: Payer: Self-pay

## 2019-08-10 DIAGNOSIS — F4321 Adjustment disorder with depressed mood: Secondary | ICD-10-CM | POA: Insufficient documentation

## 2019-08-10 DIAGNOSIS — F411 Generalized anxiety disorder: Secondary | ICD-10-CM

## 2019-08-10 DIAGNOSIS — E7800 Pure hypercholesterolemia, unspecified: Secondary | ICD-10-CM

## 2019-08-10 DIAGNOSIS — I10 Essential (primary) hypertension: Secondary | ICD-10-CM

## 2019-08-10 DIAGNOSIS — F4310 Post-traumatic stress disorder, unspecified: Secondary | ICD-10-CM | POA: Insufficient documentation

## 2019-08-10 DIAGNOSIS — K219 Gastro-esophageal reflux disease without esophagitis: Secondary | ICD-10-CM

## 2019-08-10 DIAGNOSIS — F410 Panic disorder [episodic paroxysmal anxiety] without agoraphobia: Secondary | ICD-10-CM | POA: Insufficient documentation

## 2019-08-10 DIAGNOSIS — E119 Type 2 diabetes mellitus without complications: Secondary | ICD-10-CM

## 2019-08-10 MED ORDER — SERTRALINE HCL 100 MG PO TABS: 100.0000 mg | ORAL_TABLET | Freq: Every day | ORAL | 0 refills | Status: DC

## 2019-08-10 MED ORDER — BUPROPION HCL ER (XL) 150 MG PO TB24: 150.0000 mg | ORAL_TABLET | Freq: Every day | ORAL | Status: DC

## 2019-08-10 MED ORDER — ALOGLIPTIN-PIOGLITAZONE 25-30 MG PO TABS: 1.0000 | ORAL_TABLET | Freq: Every day | ORAL | 0 refills | Status: DC

## 2019-08-10 MED ORDER — LOSARTAN POTASSIUM 100 MG PO TABS: ORAL_TABLET | ORAL | 0 refills | Status: DC

## 2019-08-10 MED ORDER — AMLODIPINE BESYLATE 5 MG PO TABS: 5.0000 mg | ORAL_TABLET | Freq: Every day | ORAL | 0 refills | Status: DC

## 2019-08-10 MED ORDER — ATENOLOL 100 MG PO TABS: 100.0000 mg | ORAL_TABLET | Freq: Every day | ORAL | 3 refills | Status: DC

## 2019-08-10 MED ORDER — GLIMEPIRIDE 4 MG PO TABS: ORAL_TABLET | ORAL | 0 refills | Status: DC

## 2019-08-10 MED ORDER — ALPRAZOLAM 0.5 MG PO TABS: 0.5000 mg | ORAL_TABLET | Freq: Every evening | ORAL | 0 refills | Status: DC | PRN

## 2019-08-10 MED ORDER — OMEPRAZOLE 20 MG PO CPDR: 20.0000 mg | DELAYED_RELEASE_CAPSULE | Freq: Every day | ORAL | 0 refills | Status: DC

## 2019-08-10 NOTE — Progress Notes (Signed)
Med refill: zoloft, Norvasc, Prilosec, Losartan, Glimepiride, Xanax, [name]  Anxiety

## 2019-08-10 NOTE — Progress Notes (Signed)
Patient ID: Scott Thomas, male   DOB: 1981-05-02, 38 y.o.   MRN: [id number] Virtual Visit via Telephone Note  I connected with Scott Thomas on 08/10/19 at  9:00 AM EDT by telephone and verified that I am speaking with the correct person using two identifiers.   Consent:  I discussed the limitations, risks, security and privacy concerns of performing an evaluation and management service by telephone and the availability of in person appointments. I also discussed with the patient that there may be a patient responsible charge related to this service. The patient expressed understanding and agreed to proceed.  Location of patient: The patient was at home  Location of provider: I was in the office Persons participating in the televisit with the patient.   No one else on the call    History of Present Illness:  38 y.o.M desiring refills on Xanax 0.5mg  using for anxiety. This is a telemedicine visit for refills.  This patient has a history of posttraumatic stress and severe panic disorder along with generalized anxiety.  He has suffered several losses over the past year including a cousin sister and another cousin.  He has a strong history of opioid use in his family and several relatives have died from overdoses.  His sister just passed away in 05/29/2023 due to complications from COVID-19 and diabetes.  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  In combination with this and other issues in his family the patient's developed a persisting panic disorder syndrome.  The patient now is in a therapy group and also has a psychotherapist along with a psychiatrist.  The psychiatrist has recommended Wellbutrin and the patient has a prescription for this but has not yet started this at 150 mg daily.  The psychiatrist also recommended Xanax and requested primary care to refill this at a 0.5 mg at bedtime dose only.  In addition to this the  patient's been on Zoloft 100 mg daily needs states this has been beneficial and the psychiatrist is in alignment.  In addition to these issues the patient is being followed for hypertension and diabetes along with reflux disease.  The patient is requesting refills on blood pressure medicines losartan and amlodipine along with an glimepiride and another oral hypoglycemic agent.  Note from 05/2019 with Dr Jillyn Hidden:  38 year old male with diabetes, hypertension, GERD and anxiety with panic attacks who is establishing care.  He reports that his prior provider retired.  He reports that his blood pressure and diabetes are well controlled.  He denies any episodes of hypoglycemia-no shaking/sweating or weakness.  He reports that his home blood sugar was 88 fasting this morning.  He occasionally will start to feel funny if he does not eat immediately after taking his glimepiride but otherwise feels that his diabetes is doing well.  He was unable to tolerate metformin in the past as it caused him to have nausea and diarrhea.  He is not currently on statin medication.  He denies any increased thirst, no urinary frequency and no blurred vision related to his diabetes.  He denies any numbness or tingling in his hands or feet.  No headache or dizziness related to his blood pressure.  He reports that omeprazole controls his acid reflux symptoms.       He reports a history of anxiety and onset of panic attacks after the death of his car daughter and a family member in a motor vehicle  accident.  Patient reports that this happened several years ago when he started having issues with chest pain and had repeated emergency room visits until he was diagnosed with anxiety disorder and panic attacks.  He has been doing well on Zoloft 100 mg.  He has recently had some increase in anxiety and depression as his younger sister who had type 1 diabetes died on May 14, 2023 from complications related to COVID-19.  He denies any suicidal thoughts or  ideations and no thoughts of self-harm.  Pos in BoLD Constitutional:   No  weight loss, night sweats,  Fevers, chills, fatigue, lassitude. HEENT:   No headaches,  Difficulty swallowing,  Tooth/dental problems,  Sore throat,                No sneezing, itching, ear ache, nasal congestion, post nasal drip,   CV:  No chest pain,  Orthopnea, PND, swelling in lower extremities, anasarca, dizziness, palpitations  GI  No heartburn, indigestion, abdominal pain, nausea, vomiting, diarrhea, change in bowel habits, loss of appetite  Resp: No shortness of breath with exertion or at rest.  No excess mucus, no productive cough,  No non-productive cough,  No coughing up of blood.  No change in color of mucus.  No wheezing.  No chest wall deformity  Skin: no rash or lesions.  GU: no dysuria, change in color of urine, no urgency or frequency.  No flank pain.  MS:  No joint pain or swelling.  No decreased range of motion.  No back pain.  Psych:   change in mood or affect.  depression or anxiety.  No memory loss.        Observations/Objective: No observations as this was a telephone visit  Assessment and Plan: 1. Essential hypertension The patient's blood pressure appears to be well controlled at this time however he does not monitor this at home.  Plan will be to refill the amlodipine, atenolol and the losartan at current prescribed dose   2. Controlled type 2 diabetes mellitus without complication, without long-term current use of insulin ([place]) Blood glucose control currently adequate  Plan will be to refill the [name] and Amaryl for now  #3 gastroesophageal reflux disease  We will refill the omeprazole for now   4. Generalized anxiety disorder with panic attacks; 5.  Grief reaction  Severe generalized anxiety disorder with associated posttraumatic stress disorder and grief reaction now under psychiatric and behavioral therapist care  The patient is requesting refills on medications and  therefore will refill the Zoloft at 100 mg daily, Xanax 0.5 mg at bedtime as needed, and the patient already has a prescription for Wellbutrin which she will begin at 150 mg daily.  The patient will continue follow-up with his primary care provider and with the psychiatrist  Follow Up Instructions: The patient knows an in office exam will be scheduled for January for this patient   I discussed the assessment and treatment plan with the patient. The patient was provided an opportunity to ask questions and all were answered. The patient agreed with the plan and demonstrated an understanding of the instructions.   The patient was advised to call back or seek an in-person evaluation if the symptoms worsen or if the condition fails to improve as anticipated.  I provided 30 minutes of non-face-to-face time during this encounter  including  median intraservice time , review of notes, labs, imaging, medications  and explaining diagnosis and management to the patient .    Asencion Noble,  MD

## 2019-11-02 ENCOUNTER — Other Ambulatory Visit: Payer: Self-pay | Admitting: Critical Care Medicine

## 2019-11-02 DIAGNOSIS — I10 Essential (primary) hypertension: Secondary | ICD-10-CM

## 2019-11-02 DIAGNOSIS — F410 Panic disorder [episodic paroxysmal anxiety] without agoraphobia: Secondary | ICD-10-CM

## 2019-11-02 DIAGNOSIS — K219 Gastro-esophageal reflux disease without esophagitis: Secondary | ICD-10-CM

## 2019-11-02 DIAGNOSIS — F411 Generalized anxiety disorder: Secondary | ICD-10-CM

## 2019-11-02 DIAGNOSIS — E119 Type 2 diabetes mellitus without complications: Secondary | ICD-10-CM

## 2019-11-05 ENCOUNTER — Telehealth: Payer: Self-pay | Admitting: Family Medicine

## 2019-11-05 ENCOUNTER — Other Ambulatory Visit: Payer: Self-pay | Admitting: *Deleted

## 2019-11-05 DIAGNOSIS — K219 Gastro-esophageal reflux disease without esophagitis: Secondary | ICD-10-CM

## 2019-11-05 DIAGNOSIS — F411 Generalized anxiety disorder: Secondary | ICD-10-CM

## 2019-11-05 DIAGNOSIS — E119 Type 2 diabetes mellitus without complications: Secondary | ICD-10-CM

## 2019-11-05 DIAGNOSIS — I10 Essential (primary) hypertension: Secondary | ICD-10-CM

## 2019-11-05 DIAGNOSIS — F410 Panic disorder [episodic paroxysmal anxiety] without agoraphobia: Secondary | ICD-10-CM

## 2019-11-05 MED ORDER — OMEPRAZOLE 20 MG PO CPDR: 20.0000 mg | DELAYED_RELEASE_CAPSULE | Freq: Every day | ORAL | 0 refills | Status: DC

## 2019-11-05 MED ORDER — AMLODIPINE BESYLATE 5 MG PO TABS: ORAL_TABLET | ORAL | 0 refills | Status: DC

## 2019-11-05 MED ORDER — ALOGLIPTIN-PIOGLITAZONE 25-30 MG PO TABS: 1.0000 | ORAL_TABLET | Freq: Every day | ORAL | 0 refills | Status: DC

## 2019-11-05 MED ORDER — ATENOLOL 100 MG PO TABS: 100.0000 mg | ORAL_TABLET | Freq: Every day | ORAL | 0 refills | Status: DC

## 2019-11-05 MED ORDER — SERTRALINE HCL 100 MG PO TABS: 100.0000 mg | ORAL_TABLET | Freq: Every day | ORAL | 0 refills | Status: DC

## 2019-11-05 MED ORDER — LOSARTAN POTASSIUM 100 MG PO TABS: 100.0000 mg | ORAL_TABLET | Freq: Every day | ORAL | 0 refills | Status: DC

## 2019-11-05 NOTE — Telephone Encounter (Signed)
Patient called and stated that he needs a refill on all of his current medications. Please send to [facility] on [address].

## 2019-11-05 NOTE — Telephone Encounter (Signed)
rx sent to pharmacy

## 2019-12-31 ENCOUNTER — Ambulatory Visit: Payer: Self-pay | Attending: Internal Medicine

## 2019-12-31 DIAGNOSIS — Z23 Encounter for immunization: Secondary | ICD-10-CM

## 2019-12-31 NOTE — Progress Notes (Signed)
   Covid-19 Vaccination Clinic  Name:  Scott Thomas    MRN: [id number] DOB: [birth year]  12/31/2019  [name] was observed post Covid-19 immunization for 15 minutes without incident. He was provided with Vaccine Information Sheet and instruction to access the V-Safe system.   [name] was instructed to call 911 with any severe reactions post vaccine: Marland Kitchen Difficulty breathing  . Swelling of face and throat  . A fast heartbeat  . A bad rash all over body  . Dizziness and weakness   Immunizations Administered    Name Date Dose VIS Date Route   Pfizer COVID-19 Vaccine 12/31/2019  2:13 PM 0.3 mL 09/25/2019 Intramuscular   Manufacturer: [employer], [employer]   Lot: [id number]   NDC: [address]-2

## 2020-01-06 ENCOUNTER — Other Ambulatory Visit: Payer: Self-pay | Admitting: Critical Care Medicine

## 2020-01-06 DIAGNOSIS — F410 Panic disorder [episodic paroxysmal anxiety] without agoraphobia: Secondary | ICD-10-CM

## 2020-01-06 DIAGNOSIS — F411 Generalized anxiety disorder: Secondary | ICD-10-CM

## 2020-01-25 ENCOUNTER — Ambulatory Visit: Payer: Self-pay | Attending: Internal Medicine

## 2020-01-25 DIAGNOSIS — Z23 Encounter for immunization: Secondary | ICD-10-CM

## 2020-01-25 NOTE — Progress Notes (Signed)
   Covid-19 Vaccination Clinic  Name:  Scott Thomas    MRN: [id number] DOB: 1981/08/18  01/25/2020  [name] was observed post Covid-19 immunization for 15 minutes without incident. He was provided with Vaccine Information Sheet and instruction to access the V-Safe system.   [name] was instructed to call 911 with any severe reactions post vaccine: Marland Kitchen Difficulty breathing  . Swelling of face and throat  . A fast heartbeat  . A bad rash all over body  . Dizziness and weakness   Immunizations Administered    Name Date Dose VIS Date Route   Pfizer COVID-19 Vaccine 01/25/2020 12:07 PM 0.3 mL 09/25/2019 Intramuscular   Manufacturer: [employer], [employer]   Lot: [id number]   NDC: [address]-4

## 2020-02-20 ENCOUNTER — Other Ambulatory Visit: Payer: Self-pay | Admitting: Family Medicine

## 2020-02-20 DIAGNOSIS — E119 Type 2 diabetes mellitus without complications: Secondary | ICD-10-CM

## 2020-04-02 ENCOUNTER — Other Ambulatory Visit: Payer: Self-pay | Admitting: Family Medicine

## 2020-04-02 ENCOUNTER — Other Ambulatory Visit: Payer: Self-pay | Admitting: Critical Care Medicine

## 2020-04-02 DIAGNOSIS — E119 Type 2 diabetes mellitus without complications: Secondary | ICD-10-CM

## 2020-04-02 DIAGNOSIS — F410 Panic disorder [episodic paroxysmal anxiety] without agoraphobia: Secondary | ICD-10-CM

## 2020-04-02 DIAGNOSIS — I10 Essential (primary) hypertension: Secondary | ICD-10-CM

## 2020-04-02 DIAGNOSIS — K219 Gastro-esophageal reflux disease without esophagitis: Secondary | ICD-10-CM

## 2020-04-14 ENCOUNTER — Other Ambulatory Visit: Payer: Self-pay | Admitting: Critical Care Medicine

## 2020-04-14 DIAGNOSIS — E119 Type 2 diabetes mellitus without complications: Secondary | ICD-10-CM

## 2020-06-15 ENCOUNTER — Ambulatory Visit: Payer: Self-pay | Admitting: Family Medicine

## 2020-06-17 ENCOUNTER — Other Ambulatory Visit: Payer: Self-pay

## 2020-06-17 ENCOUNTER — Other Ambulatory Visit: Payer: Self-pay | Admitting: Family Medicine

## 2020-06-17 ENCOUNTER — Ambulatory Visit: Payer: Self-pay | Attending: Internal Medicine | Admitting: Family Medicine

## 2020-06-17 ENCOUNTER — Telehealth: Payer: Self-pay | Admitting: Family Medicine

## 2020-06-17 DIAGNOSIS — Z91199 Patient's noncompliance with other medical treatment and regimen due to unspecified reason: Secondary | ICD-10-CM

## 2020-06-17 DIAGNOSIS — Z5329 Procedure and treatment not carried out because of patient's decision for other reasons: Secondary | ICD-10-CM

## 2020-06-17 DIAGNOSIS — E119 Type 2 diabetes mellitus without complications: Secondary | ICD-10-CM

## 2020-06-17 NOTE — Telephone Encounter (Signed)
Requested Prescriptions  Pending Prescriptions Disp Refills  . Alogliptin-Pioglitazone 25-30 MG TABS [Pharmacy Med Name: Alogliptin-Pioglitazone 25-30 MG Oral Tablet] 30 tablet 0    Sig: Take 1 tablet by mouth once daily     Endocrinology:  Diabetes - DPP-4 Inhibitor + Glitazone Combos Failed - 06/17/2020  6:32 PM      Failed - HBA1C is between 0 and 7.9 and within 180 days    Hgb A1c MFr Bld  Date Value Ref Range Status  01/09/2018 6.9 (H) 4.6 - 6.5 % Final    Comment:    Glycemic Control Guidelines for People with Diabetes:Non Diabetic:  <6%Goal of Therapy: <7%Additional Action Suggested:  >8%          Failed - Cr in normal range and within 360 days    Creatinine, Ser  Date Value Ref Range Status  01/09/2018 0.89 0.40 - 1.50 mg/dL Final         Passed - Valid encounter within last 6 months    Recent Outpatient Visits          10 months ago Generalized anxiety disorder with panic attacks   [facility] Storm Frisk, MD   1 year ago Essential hypertension   [facility] Cain Saupe, MD      Future Appointments            In 2 weeks Cain Saupe, MD [facility]           called and scheduled patient appointment. He needs labs but requested virtual My chart visit.

## 2020-06-17 NOTE — Telephone Encounter (Signed)
Requested Prescriptions  Pending Prescriptions Disp Refills   Alogliptin-Pioglitazone 25-30 MG TABS [Pharmacy Med Name: Alogliptin-Pioglitazone 25-30 MG Oral Tablet] 30 tablet 0    Sig: Take 1 tablet by mouth once daily     Endocrinology:  Diabetes - DPP-4 Inhibitor + Glitazone Combos Failed - 06/17/2020  6:32 PM      Failed - HBA1C is between 0 and 7.9 and within 180 days    Hgb A1c MFr Bld  Date Value Ref Range Status  01/09/2018 6.9 (H) 4.6 - 6.5 % Final    Comment:    Glycemic Control Guidelines for People with Diabetes:Non Diabetic:  <6%Goal of Therapy: <7%Additional Action Suggested:  >8%          Failed - Cr in normal range and within 360 days    Creatinine, Ser  Date Value Ref Range Status  01/09/2018 0.89 0.40 - 1.50 mg/dL Final         Passed - Valid encounter within last 6 months    Recent Outpatient Visits          10 months ago Generalized anxiety disorder with panic attacks   [facility] Storm Frisk, MD   1 year ago Essential hypertension   [facility] Cain Saupe, MD      Future Appointments            In 2 weeks Cain Saupe, MD [facility]           called and schedule My Chart appointment per patient preference. He needs labs

## 2020-06-19 NOTE — Progress Notes (Signed)
Patient ID: Scott Thomas, male   DOB: 08/04/1981, 39 y.o.   MRN: [id number]   Patient was contacted by the RN and patient reports that he will reschedule today's visit.

## 2020-07-01 ENCOUNTER — Encounter: Payer: Self-pay | Admitting: Family Medicine

## 2020-07-01 ENCOUNTER — Ambulatory Visit: Payer: Self-pay | Attending: Family Medicine | Admitting: Family Medicine

## 2020-07-01 DIAGNOSIS — F4320 Adjustment disorder, unspecified: Secondary | ICD-10-CM

## 2020-07-01 DIAGNOSIS — F410 Panic disorder [episodic paroxysmal anxiety] without agoraphobia: Secondary | ICD-10-CM

## 2020-07-01 DIAGNOSIS — F411 Generalized anxiety disorder: Secondary | ICD-10-CM

## 2020-07-01 DIAGNOSIS — E119 Type 2 diabetes mellitus without complications: Secondary | ICD-10-CM

## 2020-07-01 DIAGNOSIS — K219 Gastro-esophageal reflux disease without esophagitis: Secondary | ICD-10-CM

## 2020-07-01 DIAGNOSIS — F4321 Adjustment disorder with depressed mood: Secondary | ICD-10-CM

## 2020-07-01 DIAGNOSIS — F4310 Post-traumatic stress disorder, unspecified: Secondary | ICD-10-CM

## 2020-07-01 DIAGNOSIS — I10 Essential (primary) hypertension: Secondary | ICD-10-CM

## 2020-07-01 MED ORDER — AMLODIPINE BESYLATE 10 MG PO TABS: 10.0000 mg | ORAL_TABLET | Freq: Every day | ORAL | 0 refills | Status: DC

## 2020-07-01 MED ORDER — ATENOLOL 100 MG PO TABS: 100.0000 mg | ORAL_TABLET | Freq: Every day | ORAL | 0 refills | Status: DC

## 2020-07-01 MED ORDER — LOSARTAN POTASSIUM 100 MG PO TABS: 100.0000 mg | ORAL_TABLET | Freq: Every day | ORAL | 0 refills | Status: DC

## 2020-07-01 MED ORDER — ALPRAZOLAM 0.5 MG PO TABS: 0.5000 mg | ORAL_TABLET | Freq: Every evening | ORAL | 0 refills | Status: DC | PRN

## 2020-07-01 MED ORDER — ALOGLIPTIN-PIOGLITAZONE 25-30 MG PO TABS: 1.0000 | ORAL_TABLET | Freq: Every day | ORAL | 0 refills | Status: DC

## 2020-07-01 MED ORDER — SERTRALINE HCL 100 MG PO TABS: 100.0000 mg | ORAL_TABLET | Freq: Every day | ORAL | 0 refills | Status: DC

## 2020-07-01 MED ORDER — OMEPRAZOLE 20 MG PO CPDR: 20.0000 mg | DELAYED_RELEASE_CAPSULE | Freq: Every day | ORAL | 1 refills | Status: DC

## 2020-07-01 MED ORDER — GLIMEPIRIDE 4 MG PO TABS: ORAL_TABLET | ORAL | 0 refills | Status: DC

## 2020-07-01 NOTE — Progress Notes (Signed)
Virtual Visit via Telephone Note  I connected with Scott Thomas on 07/01/20 at  8:30 AM EDT by telephone and verified that I am speaking with the correct person using two identifiers.   I discussed the limitations, risks, security and privacy concerns of performing an evaluation and management service by telephone and the availability of in person appointments. I also discussed with the patient that there may be a patient responsible charge related to this service. The patient expressed understanding and agreed to proceed.  Patient Location: Home Provider Location: [facility] Others participating in call: none   History of Present Illness:         39 year old male with diabetes, hypertension and anxiety disorder with panic attacks, PTSD and prolonged grief.  He reports that he needs refills of medications for diabetes, hypertension as well as refill of sertraline and alprazolam for his anxiety/PTSD/grief.  He reports that he is receiving counseling and has also had addition of a new medication, Wellbutrin and he feels that his anxiety is improving.  He needs a refill of sertraline and would also like a refill of alprazolam to take as needed.  He still occasionally has difficulty sleeping as well as panic attacks related to the loss of his sister from Covid within the past 12 months as well as the death of his brother within the past few years.  He no longer has any siblings and he also is concerned about his own health.           He has also been receiving medications and evaluation by an online medical group for his diabetes and hypertension.  He reports no headaches or dizziness related to his blood pressure and his home blood pressures are generally in the 130s or less for the top number and 70 to 80s for the bottom number.  He reports that his blood sugars remain controlled and generally runs less than 120.  He denies any hypoglycemic episodes.  No increased thirst or urinary frequency.  He  has not had a recent diabetic eye exam.  He does state that he has had blood work recently through [facility] which was ordered by the online physician.  He also needs refill of omeprazole for continued control of acid reflux symptoms.  Reflux is controlled on his  current medication.   Past Medical History:  Diagnosis Date  . Anxiety   . Diabetes mellitus   . GERD (gastroesophageal reflux disease)   . Headache    tension headaches  . Hypertension     Past Surgical History:  Procedure Laterality Date  . CLOSED REDUCTION FINGER WITH PERCUTANEOUS PINNING Right 12/03/2015   Procedure: ORIF RIGHT RING FINGER PROXIMAL PHALANX WITH REPAIR RECONSTRUCTION ;  Surgeon: Dominica Severin, MD;  Location: [facility] OR;  Service: Orthopedics;  Laterality: Right;  Ring  . WISDOM TOOTH EXTRACTION      Family History  Problem Relation Age of Onset  . Diabetes type II Sister   . Hypertension Mother   . Diabetes type II Mother   . Hypertension Father     Social History   Tobacco Use  . Smoking status: Former Smoker    Packs/day: 0.50    Years: 7.00    Pack years: 3.50    Types: Cigarettes  . Smokeless tobacco: Never Used  . Tobacco comment: started smoking at age 56.  Vaping Use  . Vaping Use: Never used  Substance Use Topics  . Alcohol use: No  . Drug use: No  Allergies  Allergen Reactions  . Metformin     REACTION: causes nausea       Observations/Objective: No vital signs or physical exam conducted as visit was done via telephone  Assessment and Plan: 1. Generalized anxiety disorder with panic attacks; 5. PTSD; 6. Grief Reaction He reports that he is currently receiving mental health follow-up through an on-line mental health provider, [facility].  He reports that he is currently on Wellbutrin but also needs a refill of sertraline.  When questioned as to why the online mental health program was not prescribed her sertraline, patient reports that this is because he always has the medication  filled here.  He also reports continued issues with posttraumatic stress disorder/grief after his sister died this year from Covid and he has also had the death of a younger brother within the past few years leaving him with no siblings and constant concern about his own health.  He reports that he needs a refill of alprazolam as the online mental health program will not dispense this medication.  He is given a short-term supply of alprazolam 0.5 mg to take as needed for acute anxiety.  Refill provided of sertraline though I did discuss with the patient that it would be easier if he received all of his mental health medications from his current mental health provider. - sertraline (ZOLOFT) 100 MG tablet; Take 1 tablet (100 mg total) by mouth daily.  Dispense: 90 tablet; Refill: 0 - ALPRAZolam (XANAX) 0.5 MG tablet; Take 1 tablet (0.5 mg total) by mouth at bedtime as needed for anxiety.  Dispense: 30 tablet; Refill: 0  2. Controlled type 2 diabetes mellitus without complication, without long-term current use of insulin ([facility]) He reports that he has had recent blood work through an Nurse, adult.  He reports that his blood sugars continue to be well controlled.  Discussed with the patient that he does need an in person visit for diabetic foot exam and further evaluation.  Referral is being placed for patient to have his diabetic eye exam.  Refill provided for his current diabetes medications.  We will have CMA contact patient to see how his recent outside lab results can be obtained. - Ambulatory referral to Ophthalmology - glimepiride (AMARYL) 4 MG tablet; TAKE 1 TABLET BY MOUTH ONCE DAILY BEFORE BREAKFAST. Must have office visit for refills  Dispense: 90 tablet; Refill: 0 - Alogliptin-Pioglitazone 25-30 MG TABS; Take 1 tablet by mouth daily.  Dispense: 90 tablet; Refill: 0  3. Essential hypertension Refills provided of patient's losartan, atenolol and amlodipine though he also reports that he has  been receiving care from an online medical provider and has had recent blood work.  He reports that he is monitoring his blood pressure and his blood pressures are generally less than 130 systolic and 70-80 diastolic. - losartan (COZAAR) 761 MG tablet; Take 1 tablet (100 mg total) by mouth daily.  Dispense: 90 tablet; Refill: 0 - atenolol (TENORMIN) 100 MG tablet; Take 1 tablet (100 mg total) by mouth daily.  Dispense: 90 tablet; Refill: 0 - amLODipine (NORVASC) 10 MG tablet; Take 1 tablet (10 mg total) by mouth daily.  Dispense: 90 tablet; Refill: 0  4. Gastroesophageal reflux disease without esophagitis Refill provided for omeprazole for continued treatment of acid reflux and he should continue to avoid late night eating and avoid known trigger foods or foods which are spicy/greasy as well as avoidance of nonsteroidal anti-inflammatories. - omeprazole (PRILOSEC) 20 MG capsule; Take 1 capsule (20  mg total) by mouth daily.  Dispense: 90 capsule; Refill: 1    Follow Up Instructions:Return in about 3 months (around 09/30/2020) for chronic issues; sooner if needed.    I discussed the assessment and treatment plan with the patient. The patient was provided an opportunity to ask questions and all were answered. The patient agreed with the plan and demonstrated an understanding of the instructions.   The patient was advised to call back or seek an in-person evaluation if the symptoms worsen or if the condition fails to improve as anticipated.  I provided 9 minutes of non-face-to-face time during this encounter.  An additional 7 to 8 minutes was spent on completion of today's visit note and placement of orders for refills of medications.   Cain Saupe, MD

## 2020-07-04 ENCOUNTER — Telehealth: Payer: Self-pay

## 2020-07-04 NOTE — Telephone Encounter (Signed)
Patient was called and informed to contact [facility] and give them permission to fax over lab results.

## 2020-07-04 NOTE — Telephone Encounter (Signed)
-----   Message from Cain Saupe, MD sent at 07/02/2020 12:02 AM EDT ----- Regarding: follow-up He reports that he has had recent blood work through [facility].  Please contact patient to see how these labs results can be obtained so that they can be entered into his chart.

## 2020-07-12 LAB — COLOGUARD

## 2020-07-18 NOTE — Telephone Encounter (Signed)
open in error

## 2020-07-20 ENCOUNTER — Ambulatory Visit (HOSPITAL_BASED_OUTPATIENT_CLINIC_OR_DEPARTMENT_OTHER): Payer: Self-pay | Admitting: Family Medicine

## 2020-07-20 ENCOUNTER — Other Ambulatory Visit: Payer: Self-pay

## 2020-07-20 ENCOUNTER — Ambulatory Visit: Payer: Self-pay | Attending: Family Medicine | Admitting: Family Medicine

## 2020-07-20 ENCOUNTER — Encounter: Payer: Self-pay | Admitting: Family Medicine

## 2020-07-20 DIAGNOSIS — F411 Generalized anxiety disorder: Secondary | ICD-10-CM

## 2020-07-20 DIAGNOSIS — F410 Panic disorder [episodic paroxysmal anxiety] without agoraphobia: Secondary | ICD-10-CM

## 2020-07-20 DIAGNOSIS — I10 Essential (primary) hypertension: Secondary | ICD-10-CM

## 2020-07-20 DIAGNOSIS — E119 Type 2 diabetes mellitus without complications: Secondary | ICD-10-CM

## 2020-07-20 MED ORDER — AMLODIPINE BESYLATE 10 MG PO TABS: 10.0000 mg | ORAL_TABLET | Freq: Every day | ORAL | 0 refills | Status: DC

## 2020-07-20 MED ORDER — ALOGLIPTIN-PIOGLITAZONE 25-30 MG PO TABS: 1.0000 | ORAL_TABLET | Freq: Every day | ORAL | 0 refills | Status: DC

## 2020-07-20 MED ORDER — LOSARTAN POTASSIUM 100 MG PO TABS: 100.0000 mg | ORAL_TABLET | Freq: Every day | ORAL | 0 refills | Status: DC

## 2020-07-20 MED ORDER — ATENOLOL 100 MG PO TABS: 100.0000 mg | ORAL_TABLET | Freq: Every day | ORAL | 0 refills | Status: DC

## 2020-07-20 MED ORDER — GLIMEPIRIDE 4 MG PO TABS: ORAL_TABLET | ORAL | 0 refills | Status: DC

## 2020-07-20 MED ORDER — SERTRALINE HCL 100 MG PO TABS: 100.0000 mg | ORAL_TABLET | Freq: Every day | ORAL | 0 refills | Status: DC

## 2020-07-20 NOTE — Progress Notes (Signed)
Virtual Visit via Telephone Note  I connected with Scott Thomas on 07/20/20 at 11:10 AM EDT by telephone and verified that I am speaking with the correct person using two identifiers.   I discussed the limitations, risks, security and privacy concerns of performing an evaluation and management service by telephone and the availability of in person appointments. I also discussed with the patient that there may be a patient responsible charge related to this service. The patient expressed understanding and agreed to proceed.  Patient Location: Home Provider Location: [facility] Others participating in call: none   History of Present Illness:         39 yo male who needs follow-up of his chronic medical issues.  Patient had telemedicine visit last month and was asked to make an an office visit/appointment.  His last Hgb A1c was 6.9 on 01/09/2018. Per patient he had blood work done in July and he states that his Hgb A1c was 7.7.  He reports that he is monitoring his blood sugars at home and they are generally less than 120 fasting.  His lowest blood sugar has been 69.  He has not had any increased thirst, blurred vision or urinary frequency related to his diabetes.  He denies any numbness or tingling in his feet and denies any issues with foot pain or skin breakdown/calluses on the feet.  He has been using an Midwife provider, [facility] as he has been afraid to come into the office due to COVID-19.  He has received his COVID-19 immunization.  Blood pressure a few days ago was 125/89.  He denies any headaches or dizziness related to his blood pressure.  He reports that he needs a refill of sertraline.  He states that he is using an [facility], [facility], for his mental health care and they are providing him with Wellbutrin but he states that since he has been getting sertraline from this office that he would like to continue to receive the sertraline here.   Past Medical History:    Diagnosis Date  . Anxiety   . Diabetes mellitus   . GERD (gastroesophageal reflux disease)   . Headache    tension headaches  . Hypertension     Past Surgical History:  Procedure Laterality Date  . CLOSED REDUCTION FINGER WITH PERCUTANEOUS PINNING Right 12/03/2015   Procedure: ORIF RIGHT RING FINGER PROXIMAL PHALANX WITH REPAIR RECONSTRUCTION ;  Surgeon: Dominica Severin, MD;  Location: [facility] OR;  Service: Orthopedics;  Laterality: Right;  Ring  . WISDOM TOOTH EXTRACTION      Family History  Problem Relation Age of Onset  . Diabetes type II Sister   . Hypertension Mother   . Diabetes type II Mother   . Hypertension Father     Social History   Tobacco Use  . Smoking status: Former Smoker    Packs/day: 0.50    Years: 7.00    Pack years: 3.50    Types: Cigarettes  . Smokeless tobacco: Never Used  . Tobacco comment: started smoking at age 60.  Vaping Use  . Vaping Use: Never used  Substance Use Topics  . Alcohol use: No  . Drug use: No     Allergies  Allergen Reactions  . Metformin     REACTION: causes nausea       Observations/Objective: No vital signs or physical exam conducted as visit was done via telephone  Assessment and Plan: 1. Controlled type 2 diabetes mellitus without complication, without long-term current  use of insulin (HCC) Again discussed with the patient that I would like for him to have an in office appointment to have his diabetic foot exam and an actual in person examination and follow-up of his chronic medical issues.  90-day supply of medications was sent to his pharmacy but if patient is unable to come into the office for future visits, he may need to select [facility] as his primary care provider and receive all future refills through his online provider.  He reports that his most recent blood work was faxed to the office however this was not available at the time of the visit and will have CMA attempt to obtain these labs. - glimepiride (AMARYL) 4 MG  tablet; TAKE 1 TABLET BY MOUTH ONCE DAILY BEFORE BREAKFAST. Must have office visit for refills  Dispense: 90 tablet; Refill: 0 - Alogliptin-Pioglitazone 25-30 MG TABS; Take 1 tablet by mouth daily.  Dispense: 90 tablet; Refill: 0  2. Essential hypertension He reports that his blood pressure is currently controlled and refills provided of his current blood pressure medications.  Again he was asked to schedule an office follow-up. - amLODipine (NORVASC) 10 MG tablet; Take 1 tablet (10 mg total) by mouth daily.  Dispense: 90 tablet; Refill: 0 - losartan (COZAAR) 100 MG tablet; Take 1 tablet (100 mg total) by mouth daily.  Dispense: 90 tablet; Refill: 0 - atenolol (TENORMIN) 100 MG tablet; Take 1 tablet (100 mg total) by mouth daily.  Dispense: 90 tablet; Refill: 0  3. Generalized anxiety disorder with panic attacks He reports continued ongoing issues with anxiety and states that he is receiving mental health care through an online service called [facility].  He has been asked to receive all of his mental health medications through one provider to help avoid medication interactions.  He reports he is currently on Wellbutrin through the online mental health service however he would like his sertraline refilled through this office.  He is encouraged to have [facility] fill all of his medications for mental health.  90-day supply of sertraline was sent to patient's pharmacy and he should receive future refills from the online mental health service. - sertraline (ZOLOFT) 100 MG tablet; Take 1 tablet (100 mg total) by mouth daily.  Dispense: 90 tablet; Refill: 0  Follow Up Instructions:Return in about 3 months (around 10/20/2020) for Chronic issues-needs an in office appointment.    I discussed the assessment and treatment plan with the patient. The patient was provided an opportunity to ask questions and all were answered. The patient agreed with the plan and demonstrated an understanding of the instructions.    The patient was advised to call back or seek an in-person evaluation if the symptoms worsen or if the condition fails to improve as anticipated.  I provided 7 minutes of non-face-to-face time during this encounter.   Cain Saupe, MD

## 2020-07-20 NOTE — Progress Notes (Signed)
Duplicate- see visit note in chart

## 2020-07-20 NOTE — Progress Notes (Signed)
Per pt [facility] faxed over labs

## 2020-08-30 ENCOUNTER — Ambulatory Visit: Payer: Self-pay

## 2020-09-01 ENCOUNTER — Ambulatory Visit: Payer: Self-pay

## 2020-09-30 ENCOUNTER — Ambulatory Visit: Payer: Self-pay | Attending: Internal Medicine

## 2020-09-30 DIAGNOSIS — Z23 Encounter for immunization: Secondary | ICD-10-CM

## 2020-09-30 NOTE — Progress Notes (Signed)
   Covid-19 Vaccination Clinic  Name:  TREYDON HENRICKS    MRN: [id number] DOB: [birth year]  09/30/2020  Mr. Kincer was observed post Covid-19 immunization for 15 minutes without incident. He was provided with Vaccine Information Sheet and instruction to access the V-Safe system.   Mr. Taussig was instructed to call 911 with any severe reactions post vaccine: Marland Kitchen Difficulty breathing  . Swelling of face and throat  . A fast heartbeat  . A bad rash all over body  . Dizziness and weakness   Immunizations Administered    Name Date Dose VIS Date Route   Pfizer COVID-19 Vaccine 09/30/2020  1:50 PM 0.3 mL 08/03/2020 Intramuscular   Manufacturer: [employer], [employer]   Lot: [id number]   NDC: [address]-6

## 2021-03-28 ENCOUNTER — Other Ambulatory Visit: Payer: Self-pay

## 2021-04-03 ENCOUNTER — Other Ambulatory Visit: Payer: Self-pay

## 2021-05-29 ENCOUNTER — Other Ambulatory Visit: Payer: Self-pay

## 2022-06-25 LAB — COMPREHENSIVE METABOLIC PANEL
Albumin: 1.8 — AB (ref 3.5–5.0)
Calcium: 9.3 (ref 8.7–10.7)

## 2022-06-25 LAB — BASIC METABOLIC PANEL
BUN: 7 (ref 4–21)
CO2: 29 — AB (ref 13–22)
Chloride: 104 (ref 99–108)
Creatinine: 1.1 (ref 0.6–1.3)
Glucose: 167
Potassium: 4.8 mEq/L (ref 3.5–5.1)
Sodium: 141 (ref 137–147)

## 2022-06-25 LAB — HEPATIC FUNCTION PANEL
ALT: 24 U/L (ref 10–40)
AST: 13 — AB (ref 14–40)
Bilirubin, Total: 0.8

## 2022-06-25 LAB — LIPID PANEL
Cholesterol: 152 (ref 0–200)
HDL: 48 (ref 35–70)
LDL Cholesterol: 92
Triglycerides: 42 (ref 40–160)

## 2022-06-25 LAB — HEMOGLOBIN A1C: Hemoglobin A1C: 7.8

## 2022-07-16 ENCOUNTER — Encounter: Payer: Self-pay | Admitting: Internal Medicine

## 2022-07-17 ENCOUNTER — Encounter: Payer: Self-pay | Admitting: Internal Medicine

## 2022-07-17 ENCOUNTER — Ambulatory Visit (INDEPENDENT_AMBULATORY_CARE_PROVIDER_SITE_OTHER): Payer: Self-pay | Admitting: Internal Medicine

## 2022-07-17 VITALS — BP 170/100 | HR 75 | Temp 98.0°F | Resp 16 | Ht >= 80 in | Wt 308.0 lb

## 2022-07-17 DIAGNOSIS — E118 Type 2 diabetes mellitus with unspecified complications: Secondary | ICD-10-CM

## 2022-07-17 DIAGNOSIS — I10 Essential (primary) hypertension: Secondary | ICD-10-CM

## 2022-07-17 DIAGNOSIS — Z23 Encounter for immunization: Secondary | ICD-10-CM

## 2022-07-17 DIAGNOSIS — K219 Gastro-esophageal reflux disease without esophagitis: Secondary | ICD-10-CM

## 2022-07-17 DIAGNOSIS — E785 Hyperlipidemia, unspecified: Secondary | ICD-10-CM

## 2022-07-17 DIAGNOSIS — Z0001 Encounter for general adult medical examination with abnormal findings: Secondary | ICD-10-CM

## 2022-07-17 LAB — URINALYSIS, ROUTINE W REFLEX MICROSCOPIC
Bilirubin Urine: NEGATIVE
Hgb urine dipstick: NEGATIVE
Ketones, ur: NEGATIVE
Leukocytes,Ua: NEGATIVE
Nitrite: NEGATIVE
RBC / HPF: NONE SEEN (ref 0–?)
Specific Gravity, Urine: 1.02 (ref 1.000–1.030)
Total Protein, Urine: NEGATIVE
Urine Glucose: NEGATIVE
Urobilinogen, UA: 1 (ref 0.0–1.0)
pH: 6 (ref 5.0–8.0)

## 2022-07-17 LAB — TSH: TSH: 1.09 u[IU]/mL (ref 0.35–5.50)

## 2022-07-17 LAB — MICROALBUMIN / CREATININE URINE RATIO
Creatinine,U: 251.6 mg/dL
Microalb Creat Ratio: 0.7 mg/g (ref 0.0–30.0)
Microalb, Ur: 1.8 mg/dL (ref 0.0–1.9)

## 2022-07-17 LAB — PSA: PSA: 0.86 ng/mL (ref 0.10–4.00)

## 2022-07-17 MED ORDER — INSULIN PEN NEEDLE 32G X 6 MM MISC: 1.0000 | Freq: Every day | 1 refills | Status: DC

## 2022-07-17 MED ORDER — FREESTYLE LIBRE 2 SENSOR MISC: 1.0000 | Freq: Every day | 5 refills | Status: DC

## 2022-07-17 MED ORDER — ROSUVASTATIN CALCIUM 5 MG PO TABS: 5.0000 mg | ORAL_TABLET | Freq: Every day | ORAL | 1 refills | Status: DC

## 2022-07-17 MED ORDER — SOLIQUA 100-33 UNT-MCG/ML ~~LOC~~ SOPN: 20.0000 [IU] | PEN_INJECTOR | Freq: Every day | SUBCUTANEOUS | 1 refills | Status: DC

## 2022-07-17 MED ORDER — PANTOPRAZOLE SODIUM 40 MG PO TBEC: 40.0000 mg | DELAYED_RELEASE_TABLET | Freq: Every day | ORAL | 0 refills | Status: DC

## 2022-07-17 MED ORDER — FREESTYLE LIBRE 2 READER DEVI: 1.0000 | Freq: Every day | 5 refills | Status: DC

## 2022-07-17 MED ORDER — AMLODIPINE BESYLATE 10 MG PO TABS: 10.0000 mg | ORAL_TABLET | Freq: Every day | ORAL | 0 refills | Status: DC

## 2022-07-17 MED ORDER — TRIAMTERENE-HCTZ 37.5-25 MG PO CAPS: 1.0000 | ORAL_CAPSULE | Freq: Every day | ORAL | 0 refills | Status: DC

## 2022-07-17 NOTE — Progress Notes (Signed)
Subjective:  Patient ID: Scott Thomas, male    DOB: 05-27-1981  Age: 41 y.o. MRN: [id number]  CC: Hypertension, Annual Exam, Hyperlipidemia, and Diabetes   HPI Scott Thomas presents for a CPX and to establish.  He walks about a mile a day and does not experience chest pain, shortness of breath, diaphoresis, or edema.  He complains of heartburn and tells me that pantoprazole works better than omeprazole.  He complains that his current meds for diabetes are too expensive.  History Scott Thomas has a past medical history of Anxiety, Diabetes mellitus, GERD (gastroesophageal reflux disease), Headache, and Hypertension.   He has a past surgical history that includes Wisdom tooth extraction and Closed reduction finger with percutaneous pinning (Right, 12/03/2015).   His family history includes Diabetes type II in his mother and sister; Hypertension in his father and mother.He reports that he has quit smoking. His smoking use included cigarettes. He has a 3.50 pack-year smoking history. He has never used smokeless tobacco. He reports that he does not drink alcohol and does not use drugs.  Outpatient Medications Prior to Visit  Medication Sig Dispense Refill   buPROPion (WELLBUTRIN XL) 150 MG 24 hr tablet Take 1 tablet (150 mg total) by mouth daily.     sertraline (ZOLOFT) 100 MG tablet Take 1 tablet (100 mg total) by mouth daily. 90 tablet 0   Alogliptin-Pioglitazone 25-30 MG TABS Take 1 tablet by mouth daily. 90 tablet 0   ALPRAZolam (XANAX) 0.5 MG tablet Take 1 tablet (0.5 mg total) by mouth at bedtime as needed for anxiety. 30 tablet 0   amLODipine (NORVASC) 10 MG tablet Take 1 tablet (10 mg total) by mouth daily. 90 tablet 0   atenolol (TENORMIN) 100 MG tablet Take 1 tablet (100 mg total) by mouth daily. 90 tablet 0   glimepiride (AMARYL) 4 MG tablet TAKE 1 TABLET BY MOUTH ONCE DAILY BEFORE BREAKFAST. Must have office visit for refills 90 tablet 0   losartan (COZAAR) 100 MG tablet Take 1  tablet (100 mg total) by mouth daily. 90 tablet 0   omeprazole (PRILOSEC) 20 MG capsule Take 1 capsule (20 mg total) by mouth daily. 90 capsule 1   No facility-administered medications prior to visit.    ROS Review of Systems  Constitutional:  Negative for chills, diaphoresis, fatigue and fever.  HENT: Negative.    Eyes: Negative.   Respiratory:  Negative for cough, chest tightness, shortness of breath and wheezing.   Cardiovascular:  Negative for chest pain, palpitations and leg swelling.  Gastrointestinal:  Negative for abdominal pain, diarrhea, nausea and vomiting.  Endocrine: Negative.   Genitourinary: Negative.  Negative for difficulty urinating and dysuria.  Musculoskeletal: Negative.   Skin: Negative.   Neurological:  Negative for dizziness, weakness, light-headedness and numbness.  Hematological:  Negative for adenopathy. Does not bruise/bleed easily.  Psychiatric/Behavioral: Negative.      Objective:  BP (!) 170/100 (BP Location: Right Arm, Patient Position: Sitting, Cuff Size: Large)   Pulse 75   Temp 98 F (36.7 C) (Oral)   Resp 16   Ht 6\' 10"  (2.083 m)   Wt (!) 308 lb (139.7 kg)   SpO2 98%   BMI 32.21 kg/m   Physical Exam Vitals reviewed.  Constitutional:      Appearance: He is not ill-appearing.  HENT:     Mouth/Throat:     Mouth: Mucous membranes are moist.  Eyes:     General: No scleral icterus.    Conjunctiva/sclera:  Conjunctivae normal.  Cardiovascular:     Rate and Rhythm: Normal rate and regular rhythm.     Heart sounds: Normal heart sounds, S1 normal and S2 normal. No murmur heard.    No friction rub. No gallop.     Comments: EKG- NSR, 67 bpm No LVH or Q waves Flat/inverted T waves in III and aVL are old Pulmonary:     Effort: Pulmonary effort is normal.     Breath sounds: No stridor. No wheezing, rhonchi or rales.  Abdominal:     General: Abdomen is flat.     Palpations: There is no mass.     Tenderness: There is no abdominal tenderness.  There is no guarding.     Hernia: No hernia is present.  Musculoskeletal:        General: Normal range of motion.     Cervical back: Neck supple.     Right lower leg: No edema.     Left lower leg: No edema.  Skin:    General: Skin is warm and dry.  Neurological:     General: No focal deficit present.     Mental Status: He is alert.  Psychiatric:        Mood and Affect: Mood normal.        Behavior: Behavior normal.     Lab Results  Component Value Date   WBC 4.1 02/11/2017   HGB 14.8 02/11/2017   HCT 44.4 02/11/2017   PLT 214.0 02/11/2017   GLUCOSE 144 (H) 01/09/2018   CHOL 152 06/25/2022   TRIG 42 06/25/2022   HDL 48 06/25/2022   LDLCALC 92 06/25/2022   ALT 24 06/25/2022   AST 13 (A) 06/25/2022   NA 141 06/25/2022   K 4.8 06/25/2022   CL 104 06/25/2022   CREATININE 1.1 06/25/2022   BUN 7 06/25/2022   CO2 29 (A) 06/25/2022   TSH 1.09 07/17/2022   PSA 0.86 07/17/2022   HGBA1C 7.8 06/25/2022   MICROALBUR 1.8 07/17/2022     Assessment & Plan:   Scott Thomas was seen today for hypertension, annual exam, hyperlipidemia and diabetes.  Diagnoses and all orders for this visit:  Type II diabetes mellitus with manifestations (HCC)- Will treat with a GLP-1 agonist and basal insulin. -     HM Diabetes Foot Exam -     Microalbumin / creatinine urine ratio; Future -     Ambulatory referral to Ophthalmology -     AMB Referral to [facility] -     Microalbumin / creatinine urine ratio -     Insulin Glargine-Lixisenatide (SOLIQUA) 100-33 UNT-MCG/ML SOPN; Inject 20 Units into the skin daily. -     Insulin Pen Needle 32G X 6 MM MISC; 1 Act by Does not apply route daily. -     Continuous Blood Gluc Sensor (FREESTYLE LIBRE 2 SENSOR) MISC; 1 Act by Does not apply route daily. -     Continuous Blood Gluc Receiver (FREESTYLE LIBRE 2 READER) DEVI; 1 Act by Does not apply route daily.  Hypertension, accelerated- Will treat with amlodipine, triamterene, and  hydrochlorothiazide. -     TSH; Future -     Urinalysis, Routine w reflex microscopic; Future -     Aldosterone + renin activity w/ ratio; Future -     EKG 12-Lead -     amLODipine (NORVASC) 10 MG tablet; Take 1 tablet (10 mg total) by mouth daily. -     AMB Referral to [facility] -  Aldosterone + renin activity w/ ratio -     Urinalysis, Routine w reflex microscopic -     TSH -     triamterene-hydrochlorothiazide (DYAZIDE) 37.5-25 MG capsule; Take 1 each (1 capsule total) by mouth daily.  Hyperlipidemia LDL goal <70- I recommended that he take a statin for cardiovascular risk reduction. -     TSH; Future -     rosuvastatin (CRESTOR) 5 MG tablet; Take 1 tablet (5 mg total) by mouth daily. -     TSH  Encounter for general adult medical examination with abnormal findings- Exam completed, labs reviewed, vaccines reviewed and updated, cancer screenings are up-to-date, patient education was given. -     PSA; Future -     PSA  Gastroesophageal reflux disease without esophagitis -     pantoprazole (PROTONIX) 40 MG tablet; Take 1 tablet (40 mg total) by mouth daily.  Other orders -     Flu Vaccine QUAD 6+ mos PF IM (Fluarix Quad PF) -     Tdap vaccine greater than or equal to 7yo IM   I have discontinued Scott Thomas's omeprazole, ALPRAZolam, glimepiride, Alogliptin-Pioglitazone, and atenolol. I am also having him start on rosuvastatin, pantoprazole, triamterene-hydrochlorothiazide, Soliqua, Insulin Pen Needle, FreeStyle Libre 2 Sensor, and [employer] 2 Reader. Additionally, I am having him maintain his buPROPion, sertraline, and amLODipine.  Meds ordered this encounter  Medications   rosuvastatin (CRESTOR) 5 MG tablet    Sig: Take 1 tablet (5 mg total) by mouth daily.    Dispense:  90 tablet    Refill:  1   amLODipine (NORVASC) 10 MG tablet    Sig: Take 1 tablet (10 mg total) by mouth daily.    Dispense:  90 tablet    Refill:  0   pantoprazole (PROTONIX)  40 MG tablet    Sig: Take 1 tablet (40 mg total) by mouth daily.    Dispense:  90 tablet    Refill:  0   triamterene-hydrochlorothiazide (DYAZIDE) 37.5-25 MG capsule    Sig: Take 1 each (1 capsule total) by mouth daily.    Dispense:  90 capsule    Refill:  0   Insulin Glargine-Lixisenatide (SOLIQUA) 100-33 UNT-MCG/ML SOPN    Sig: Inject 20 Units into the skin daily.    Dispense:  18 mL    Refill:  1   Insulin Pen Needle 32G X 6 MM MISC    Sig: 1 Act by Does not apply route daily.    Dispense:  100 each    Refill:  1   Continuous Blood Gluc Sensor (FREESTYLE LIBRE 2 SENSOR) MISC    Sig: 1 Act by Does not apply route daily.    Dispense:  2 each    Refill:  5   Continuous Blood Gluc Receiver (FREESTYLE LIBRE 2 READER) DEVI    Sig: 1 Act by Does not apply route daily.    Dispense:  2 each    Refill:  5     Follow-up: Return in about 6 weeks (around 08/28/2022).  Scarlette Calico, MD

## 2022-07-17 NOTE — Patient Instructions (Signed)
Hypertension, Adult High blood pressure (hypertension) is when the force of blood pumping through the arteries is too strong. The arteries are the blood vessels that carry blood from the heart throughout the body. Hypertension forces the heart to work harder to pump blood and may cause arteries to become narrow or stiff. Untreated or uncontrolled hypertension can lead to a heart attack, heart failure, a stroke, kidney disease, and other problems. A blood pressure reading consists of a higher number over a lower number. Ideally, your blood pressure should be below 120/80. The first ("top") number is called the systolic pressure. It is a measure of the pressure in your arteries as your heart beats. The second ("bottom") number is called the diastolic pressure. It is a measure of the pressure in your arteries as the heart relaxes. What are the causes? The exact cause of this condition is not known. There are some conditions that result in high blood pressure. What increases the risk? Certain factors may make you more likely to develop high blood pressure. Some of these risk factors are under your control, including: Smoking. Not getting enough exercise or physical activity. Being overweight. Having too much fat, sugar, calories, or salt (sodium) in your diet. Drinking too much alcohol. Other risk factors include: Having a personal history of heart disease, diabetes, high cholesterol, or kidney disease. Stress. Having a family history of high blood pressure and high cholesterol. Having obstructive sleep apnea. Age. The risk increases with age. What are the signs or symptoms? High blood pressure may not cause symptoms. Very high blood pressure (hypertensive crisis) may cause: Headache. Fast or irregular heartbeats (palpitations). Shortness of breath. Nosebleed. Nausea and vomiting. Vision changes. Severe chest pain, dizziness, and seizures. How is this diagnosed? This condition is diagnosed by  measuring your blood pressure while you are seated, with your arm resting on a flat surface, your legs uncrossed, and your feet flat on the floor. The cuff of the blood pressure monitor will be placed directly against the skin of your upper arm at the level of your heart. Blood pressure should be measured at least twice using the same arm. Certain conditions can cause a difference in blood pressure between your right and left arms. If you have a high blood pressure reading during one visit or you have normal blood pressure with other risk factors, you may be asked to: Return on a different day to have your blood pressure checked again. Monitor your blood pressure at home for 1 week or longer. If you are diagnosed with hypertension, you may have other blood or imaging tests to help your health care provider understand your overall risk for other conditions. How is this treated? This condition is treated by making healthy lifestyle changes, such as eating healthy foods, exercising more, and reducing your alcohol intake. You may be referred for counseling on a healthy diet and physical activity. Your health care provider may prescribe medicine if lifestyle changes are not enough to get your blood pressure under control and if: Your systolic blood pressure is above 130. Your diastolic blood pressure is above 80. Your personal target blood pressure may vary depending on your medical conditions, your age, and other factors. Follow these instructions at home: Eating and drinking  Eat a diet that is high in fiber and potassium, and low in sodium, added sugar, and fat. An example of this eating plan is called the DASH diet. DASH stands for Dietary Approaches to Stop Hypertension. To eat this way: Eat   plenty of fresh fruits and vegetables. Try to fill one half of your plate at each meal with fruits and vegetables. Eat whole grains, such as whole-wheat pasta, brown rice, or whole-grain bread. Fill about one  fourth of your plate with whole grains. Eat or drink low-fat dairy products, such as skim milk or low-fat yogurt. Avoid fatty cuts of meat, processed or cured meats, and poultry with skin. Fill about one fourth of your plate with lean proteins, such as fish, chicken without skin, beans, eggs, or tofu. Avoid pre-made and processed foods. These tend to be higher in sodium, added sugar, and fat. Reduce your daily sodium intake. Many people with hypertension should eat less than 1,500 mg of sodium a day. Do not drink alcohol if: Your health care provider tells you not to drink. You are pregnant, may be pregnant, or are planning to become pregnant. If you drink alcohol: Limit how much you have to: 0-1 drink a day for women. 0-2 drinks a day for men. Know how much alcohol is in your drink. In the [place] one drink equals one 12 oz bottle of beer (355 mL), one 5 oz glass of wine (148 mL), or one 1 oz glass of hard liquor (44 mL). Lifestyle  Work with your health care provider to maintain a healthy body weight or to lose weight. Ask what an ideal weight is for you. Get at least 30 minutes of exercise that causes your heart to beat faster (aerobic exercise) most days of the week. Activities may include walking, swimming, or biking. Include exercise to strengthen your muscles (resistance exercise), such as Pilates or lifting weights, as part of your weekly exercise routine. Try to do these types of exercises for 30 minutes at least 3 days a week. Do not use any products that contain nicotine or tobacco. These products include cigarettes, chewing tobacco, and vaping devices, such as e-cigarettes. If you need help quitting, ask your health care provider. Monitor your blood pressure at home as told by your health care provider. Keep all follow-up visits. This is important. Medicines Take over-the-counter and prescription medicines only as told by your health care provider. Follow directions carefully. Blood  pressure medicines must be taken as prescribed. Do not skip doses of blood pressure medicine. Doing this puts you at risk for problems and can make the medicine less effective. Ask your health care provider about side effects or reactions to medicines that you should watch for. Contact a health care provider if you: Think you are having a reaction to a medicine you are taking. Have headaches that keep coming back (recurring). Feel dizzy. Have swelling in your ankles. Have trouble with your vision. Get help right away if you: Develop a severe headache or confusion. Have unusual weakness or numbness. Feel faint. Have severe pain in your chest or abdomen. Vomit repeatedly. Have trouble breathing. These symptoms may be an emergency. Get help right away. Call 911. Do not wait to see if the symptoms will go away. Do not drive yourself to the hospital. Summary Hypertension is when the force of blood pumping through your arteries is too strong. If this condition is not controlled, it may put you at risk for serious complications. Your personal target blood pressure may vary depending on your medical conditions, your age, and other factors. For most people, a normal blood pressure is less than 120/80. Hypertension is treated with lifestyle changes, medicines, or a combination of both. Lifestyle changes include losing weight, eating a healthy,   low-sodium diet, exercising more, and limiting alcohol. This information is not intended to replace advice given to you by your health care provider. Make sure you discuss any questions you have with your health care provider. Document Revised: 08/08/2021 Document Reviewed: 08/08/2021 Elsevier Patient Education  2023 [employer]  

## 2022-07-18 ENCOUNTER — Telehealth: Payer: Self-pay

## 2022-07-18 ENCOUNTER — Ambulatory Visit: Payer: Self-pay | Admitting: Licensed Clinical Social Worker

## 2022-07-18 NOTE — Patient Outreach (Signed)
  Care Coordination   Initial Visit Note   07/18/2022 Name: Scott Thomas MRN: [id number] DOB: April 26, 1981  Scott Thomas is a 41 y.o. year old male who sees Janith Lima, MD for primary care. I spoke with  Alphonsa Overall by phone today.  What matters to the patients health and wellness today?  Cost of medication    Goals Addressed               This Visit's Progress     Care coordination activities- Medication Cost (pt-stated)        Patient stated the cost of his insulin is too much. Patient stated he has contacted his PCP via inbasket.  SW completed SDOH screening. No additional concerns or needs present.  Patient understands how to contact SW if needed.        SDOH assessments and interventions completed:  Yes  SDOH Interventions Today    Flowsheet Row Most Recent Value  SDOH Interventions   Financial Strain Interventions Other (Comment)  [Patient is speaking with PCP regarding cost of medication.]        Care Coordination Interventions Activated:  Yes  Care Coordination Interventions:  Yes, provided   Follow up plan: No further intervention required.   Encounter Outcome:  Pt. Visit Completed

## 2022-07-18 NOTE — Patient Instructions (Signed)
    Patient was not contacted during this encounter.  LCSW collaborated with care team to accomplish patient's care plan goal   Alice Vitelli, LCSW Social Work Care Coordination  [facility] [phone]  

## 2022-07-18 NOTE — Patient Instructions (Signed)
Visit Information  Thank you for taking time to visit with me today. Please don't hesitate to contact me if I can be of assistance to you.   Following are the goals we discussed today:   Goals Addressed               This Visit's Progress     Care coordination activities- Medication Cost (pt-stated)        Patient stated the cost of his insulin is too much. Patient stated he has contacted his PCP via inbasket.  SW completed SDOH screening. No additional concerns or needs present.  Patient understands how to contact SW if needed.          Patient verbalizes understanding of instructions and care plan provided today and agrees to view in [place]. Active MyChart status and patient understanding of how to access instructions and care plan via MyChart confirmed with patient.     No further follow up required: .  Lenor Derrick , MSW Social Worker [facility]  [phone]

## 2022-07-18 NOTE — Telephone Encounter (Signed)
Pharmacy staff is calling wondering the pt should be taking the new medication prescribed: triamterene-hydrochlorothiazide (DYAZIDE) 37.5-25 MG capsule With  Losartan-HCTZ (COZAAR) 100 MG/12.5mg  tablet that was prescribed by Dr. Mikel Cella. Ry at the pharmacy states they have been filling that Rx as far back as July of 2022.   Please advise if he should continue taking both.  Ry cb [phone] at [facility] [address]

## 2022-07-18 NOTE — Patient Outreach (Signed)
  Care Coordination Note    07/18/2022 Name: Scott Thomas MRN: [id number] DOB: 1981-06-19  Scott Thomas is a 41 y.o. year old male who sees Janith Lima, MD for primary care.    Consultation and Collaboration  reference care coordination for  message from PCP reference patient unable to afford medication . Patient was not interviewed or contacted during this encounter.   Intervention:Conducted brief assessment, recommendations and relevant information discussed.  Collaborated with pharmacy care coordination  to assist with meeting patient's needs.      SDOH assessments and interventions completed:  No    Care Coordination Interventions Activated:  Yes   Care Coordination Interventions:  Yes, provided   Follow up plan:  No further intervention required by Social Work Care Coordination. Referral made to Curlene Labrum, Care Coordination Pharmacy for further assessment and intervention/ referral options   Casimer Lanius, [facility] [phone]

## 2022-07-19 ENCOUNTER — Other Ambulatory Visit: Payer: Self-pay | Admitting: Internal Medicine

## 2022-07-19 ENCOUNTER — Other Ambulatory Visit (HOSPITAL_COMMUNITY): Payer: Self-pay

## 2022-07-20 NOTE — Telephone Encounter (Signed)
Pharmacy has been informed to d/c losartan. Proceed with dyazide and amlodipine.

## 2022-07-20 NOTE — Telephone Encounter (Signed)
Please call patient and let him know what his current medications for blood pressure and hypertension are - he is not sure what medications to stop and what medications to start.

## 2022-07-23 ENCOUNTER — Telehealth: Payer: Self-pay

## 2022-07-23 NOTE — Telephone Encounter (Signed)
Spoke with pt regarding possible patient assistance thru [employer] for [employer] insulin. Pt is interesting in applying. He would like me to mail the application to his home & he will return completed form to me.

## 2022-07-24 ENCOUNTER — Other Ambulatory Visit (HOSPITAL_COMMUNITY): Payer: Self-pay

## 2022-07-26 LAB — ALDOSTERONE + RENIN ACTIVITY W/ RATIO
ALDO / PRA Ratio: 45.5 Ratio — ABNORMAL HIGH (ref 0.9–28.9)
Aldosterone: 5 ng/dL
Renin Activity: 0.11 ng/mL/h — ABNORMAL LOW (ref 0.25–5.82)

## 2022-08-10 ENCOUNTER — Other Ambulatory Visit: Payer: Self-pay | Admitting: Internal Medicine

## 2022-08-10 DIAGNOSIS — E118 Type 2 diabetes mellitus with unspecified complications: Secondary | ICD-10-CM

## 2022-08-10 MED ORDER — METFORMIN HCL ER 750 MG PO TB24: 750.0000 mg | ORAL_TABLET | Freq: Every day | ORAL | 1 refills | Status: DC

## 2022-08-28 ENCOUNTER — Ambulatory Visit: Payer: Self-pay | Admitting: Internal Medicine

## 2022-10-03 ENCOUNTER — Ambulatory Visit (INDEPENDENT_AMBULATORY_CARE_PROVIDER_SITE_OTHER): Payer: Self-pay | Admitting: Internal Medicine

## 2022-10-03 ENCOUNTER — Encounter: Payer: Self-pay | Admitting: Internal Medicine

## 2022-10-03 VITALS — BP 138/88 | HR 80 | Temp 98.3°F | Resp 16 | Ht >= 80 in | Wt 302.6 lb

## 2022-10-03 DIAGNOSIS — I10 Essential (primary) hypertension: Secondary | ICD-10-CM

## 2022-10-03 DIAGNOSIS — E118 Type 2 diabetes mellitus with unspecified complications: Secondary | ICD-10-CM

## 2022-10-03 DIAGNOSIS — Z23 Encounter for immunization: Secondary | ICD-10-CM

## 2022-10-03 LAB — BASIC METABOLIC PANEL
BUN: 12 mg/dL (ref 6–23)
CO2: 30 mEq/L (ref 19–32)
Calcium: 9.8 mg/dL (ref 8.4–10.5)
Chloride: 100 mEq/L (ref 96–112)
Creatinine, Ser: 1.08 mg/dL (ref 0.40–1.50)
GFR: 84.99 mL/min (ref >60.00)
Glucose, Bld: 198 mg/dL — ABNORMAL HIGH (ref 70–99)
Potassium: 4.5 mEq/L (ref 3.5–5.1)
Sodium: 136 mEq/L (ref 135–145)

## 2022-10-03 LAB — HEMOGLOBIN A1C: Hgb A1c MFr Bld: 8.1 % — ABNORMAL HIGH (ref 4.6–6.5)

## 2022-10-03 MED ORDER — FREESTYLE LIBRE 3 SENSOR MISC: 1.0000 | Freq: Every day | 5 refills | Status: DC

## 2022-10-03 NOTE — Patient Instructions (Signed)
Hypertension, Adult High blood pressure (hypertension) is when the force of blood pumping through the arteries is too strong. The arteries are the blood vessels that carry blood from the heart throughout the body. Hypertension forces the heart to work harder to pump blood and may cause arteries to become narrow or stiff. Untreated or uncontrolled hypertension can lead to a heart attack, heart failure, a stroke, kidney disease, and other problems. A blood pressure reading consists of a higher number over a lower number. Ideally, your blood pressure should be below 120/80. The first ("top") number is called the systolic pressure. It is a measure of the pressure in your arteries as your heart beats. The second ("bottom") number is called the diastolic pressure. It is a measure of the pressure in your arteries as the heart relaxes. What are the causes? The exact cause of this condition is not known. There are some conditions that result in high blood pressure. What increases the risk? Certain factors may make you more likely to develop high blood pressure. Some of these risk factors are under your control, including: Smoking. Not getting enough exercise or physical activity. Being overweight. Having too much fat, sugar, calories, or salt (sodium) in your diet. Drinking too much alcohol. Other risk factors include: Having a personal history of heart disease, diabetes, high cholesterol, or kidney disease. Stress. Having a family history of high blood pressure and high cholesterol. Having obstructive sleep apnea. Age. The risk increases with age. What are the signs or symptoms? High blood pressure may not cause symptoms. Very high blood pressure (hypertensive crisis) may cause: Headache. Fast or irregular heartbeats (palpitations). Shortness of breath. Nosebleed. Nausea and vomiting. Vision changes. Severe chest pain, dizziness, and seizures. How is this diagnosed? This condition is diagnosed by  measuring your blood pressure while you are seated, with your arm resting on a flat surface, your legs uncrossed, and your feet flat on the floor. The cuff of the blood pressure monitor will be placed directly against the skin of your upper arm at the level of your heart. Blood pressure should be measured at least twice using the same arm. Certain conditions can cause a difference in blood pressure between your right and left arms. If you have a high blood pressure reading during one visit or you have normal blood pressure with other risk factors, you may be asked to: Return on a different day to have your blood pressure checked again. Monitor your blood pressure at home for 1 week or longer. If you are diagnosed with hypertension, you may have other blood or imaging tests to help your health care provider understand your overall risk for other conditions. How is this treated? This condition is treated by making healthy lifestyle changes, such as eating healthy foods, exercising more, and reducing your alcohol intake. You may be referred for counseling on a healthy diet and physical activity. Your health care provider may prescribe medicine if lifestyle changes are not enough to get your blood pressure under control and if: Your systolic blood pressure is above 130. Your diastolic blood pressure is above 80. Your personal target blood pressure may vary depending on your medical conditions, your age, and other factors. Follow these instructions at home: Eating and drinking  Eat a diet that is high in fiber and potassium, and low in sodium, added sugar, and fat. An example of this eating plan is called the DASH diet. DASH stands for Dietary Approaches to Stop Hypertension. To eat this way: Eat   plenty of fresh fruits and vegetables. Try to fill one half of your plate at each meal with fruits and vegetables. Eat whole grains, such as whole-wheat pasta, brown rice, or whole-grain bread. Fill about one  fourth of your plate with whole grains. Eat or drink low-fat dairy products, such as skim milk or low-fat yogurt. Avoid fatty cuts of meat, processed or cured meats, and poultry with skin. Fill about one fourth of your plate with lean proteins, such as fish, chicken without skin, beans, eggs, or tofu. Avoid pre-made and processed foods. These tend to be higher in sodium, added sugar, and fat. Reduce your daily sodium intake. Many people with hypertension should eat less than 1,500 mg of sodium a day. Do not drink alcohol if: Your health care provider tells you not to drink. You are pregnant, may be pregnant, or are planning to become pregnant. If you drink alcohol: Limit how much you have to: 0-1 drink a day for women. 0-2 drinks a day for men. Know how much alcohol is in your drink. In the [place] one drink equals one 12 oz bottle of beer (355 mL), one 5 oz glass of wine (148 mL), or one 1 oz glass of hard liquor (44 mL). Lifestyle  Work with your health care provider to maintain a healthy body weight or to lose weight. Ask what an ideal weight is for you. Get at least 30 minutes of exercise that causes your heart to beat faster (aerobic exercise) most days of the week. Activities may include walking, swimming, or biking. Include exercise to strengthen your muscles (resistance exercise), such as Pilates or lifting weights, as part of your weekly exercise routine. Try to do these types of exercises for 30 minutes at least 3 days a week. Do not use any products that contain nicotine or tobacco. These products include cigarettes, chewing tobacco, and vaping devices, such as e-cigarettes. If you need help quitting, ask your health care provider. Monitor your blood pressure at home as told by your health care provider. Keep all follow-up visits. This is important. Medicines Take over-the-counter and prescription medicines only as told by your health care provider. Follow directions carefully. Blood  pressure medicines must be taken as prescribed. Do not skip doses of blood pressure medicine. Doing this puts you at risk for problems and can make the medicine less effective. Ask your health care provider about side effects or reactions to medicines that you should watch for. Contact a health care provider if you: Think you are having a reaction to a medicine you are taking. Have headaches that keep coming back (recurring). Feel dizzy. Have swelling in your ankles. Have trouble with your vision. Get help right away if you: Develop a severe headache or confusion. Have unusual weakness or numbness. Feel faint. Have severe pain in your chest or abdomen. Vomit repeatedly. Have trouble breathing. These symptoms may be an emergency. Get help right away. Call 911. Do not wait to see if the symptoms will go away. Do not drive yourself to the hospital. Summary Hypertension is when the force of blood pumping through your arteries is too strong. If this condition is not controlled, it may put you at risk for serious complications. Your personal target blood pressure may vary depending on your medical conditions, your age, and other factors. For most people, a normal blood pressure is less than 120/80. Hypertension is treated with lifestyle changes, medicines, or a combination of both. Lifestyle changes include losing weight, eating a healthy,   low-sodium diet, exercising more, and limiting alcohol. This information is not intended to replace advice given to you by your health care provider. Make sure you discuss any questions you have with your health care provider. Document Revised: 08/08/2021 Document Reviewed: 08/08/2021 Elsevier Patient Education  2023 [employer]  

## 2022-10-03 NOTE — Progress Notes (Signed)
Subjective:  Patient ID: Scott Thomas, male    DOB: 06-11-1981  Age: 41 y.o. MRN: [id number]  CC: Hypertension and Diabetes   HPI Scott Thomas presents for f/up -  He is losing weight with lifestyle modifications.  He has felt well recently and offers no complaints.  Outpatient Medications Prior to Visit  Medication Sig Dispense Refill   amLODipine (NORVASC) 10 MG tablet Take 1 tablet (10 mg total) by mouth daily. 90 tablet 0   buPROPion (WELLBUTRIN XL) 150 MG 24 hr tablet Take 1 tablet (150 mg total) by mouth daily.     metFORMIN (GLUCOPHAGE-XR) 750 MG 24 hr tablet Take 1 tablet (750 mg total) by mouth daily with breakfast. 90 tablet 1   pantoprazole (PROTONIX) 40 MG tablet Take 1 tablet (40 mg total) by mouth daily. 90 tablet 0   rosuvastatin (CRESTOR) 5 MG tablet Take 1 tablet (5 mg total) by mouth daily. 90 tablet 1   sertraline (ZOLOFT) 100 MG tablet Take 1 tablet (100 mg total) by mouth daily. 90 tablet 0   triamterene-hydrochlorothiazide (DYAZIDE) 37.5-25 MG capsule Take 1 each (1 capsule total) by mouth daily. 90 capsule 0   Continuous Blood Gluc Receiver (FREESTYLE LIBRE 2 READER) DEVI 1 Act by Does not apply route daily. 2 each 5   Insulin Pen Needle 32G X 6 MM MISC 1 Act by Does not apply route daily. 100 each 1   Continuous Blood Gluc Sensor (FREESTYLE LIBRE 2 SENSOR) MISC 1 Act by Does not apply route daily. 2 each 5   No facility-administered medications prior to visit.    ROS Review of Systems  Constitutional: Negative.  Negative for diaphoresis, fatigue and fever.  HENT: Negative.    Eyes: Negative.   Respiratory:  Negative for cough, chest tightness, shortness of breath and wheezing.   Cardiovascular:  Negative for chest pain, palpitations and leg swelling.  Gastrointestinal:  Negative for abdominal pain, diarrhea, nausea and vomiting.  Endocrine: Negative.   Genitourinary: Negative.  Negative for difficulty urinating.  Musculoskeletal: Negative.   Negative for arthralgias and myalgias.  Skin: Negative.   Neurological: Negative.  Negative for dizziness and weakness.  Hematological:  Negative for adenopathy. Does not bruise/bleed easily.  Psychiatric/Behavioral: Negative.      Objective:  BP 138/88 (BP Location: Right Arm)   Pulse 80   Temp 98.3 F (36.8 C) (Oral)   Resp 16   Ht 6\' 10"  (2.083 m)   Wt (!) 302 lb 9.6 oz (137.3 kg)   SpO2 99%   BMI 31.64 kg/m   BP Readings from Last 3 Encounters:  10/03/22 138/88  07/17/22 (!) 170/100  04/22/18 (!) 152/90    Wt Readings from Last 3 Encounters:  10/03/22 (!) 302 lb 9.6 oz (137.3 kg)  07/17/22 (!) 308 lb (139.7 kg)  04/22/18 (!) 315 lb (142.9 kg)    Physical Exam Vitals reviewed.  HENT:     Nose: Nose normal.     Mouth/Throat:     Mouth: Mucous membranes are moist.  Eyes:     General: No scleral icterus.    Conjunctiva/sclera: Conjunctivae normal.  Cardiovascular:     Rate and Rhythm: Normal rate and regular rhythm.     Heart sounds: No murmur heard. Pulmonary:     Effort: Pulmonary effort is normal.     Breath sounds: No stridor. No wheezing, rhonchi or rales.  Abdominal:     General: Abdomen is flat.     Palpations: There  is no mass.     Tenderness: There is no abdominal tenderness. There is no guarding.     Hernia: No hernia is present.  Musculoskeletal:        General: Normal range of motion.     Cervical back: Neck supple.     Right lower leg: No edema.     Left lower leg: No edema.  Lymphadenopathy:     Cervical: No cervical adenopathy.  Skin:    General: Skin is warm and dry.  Neurological:     General: No focal deficit present.     Mental Status: He is alert.  Psychiatric:        Mood and Affect: Mood normal.        Behavior: Behavior normal.     Lab Results  Component Value Date   WBC 4.1 02/11/2017   HGB 14.8 02/11/2017   HCT 44.4 02/11/2017   PLT 214.0 02/11/2017   GLUCOSE 198 (H) 10/03/2022   CHOL 152 06/25/2022   TRIG 42  06/25/2022   HDL 48 06/25/2022   LDLCALC 92 06/25/2022   ALT 24 06/25/2022   AST 13 (A) 06/25/2022   NA 136 10/03/2022   K 4.5 10/03/2022   CL 100 10/03/2022   CREATININE 1.08 10/03/2022   BUN 12 10/03/2022   CO2 30 10/03/2022   TSH 1.09 07/17/2022   PSA 0.86 07/17/2022   HGBA1C 8.1 (H) 10/03/2022   MICROALBUR 1.8 07/17/2022    DG Ankle Complete Left  Result Date: 06/01/2017 CLINICAL DATA:  Stepped in a hole, lateral pain EXAM: LEFT ANKLE COMPLETE - 3+ VIEW COMPARISON:  06/11/2010 FINDINGS: No malalignment. Questionable lucency anterior calcaneus on lateral view. Ankle mortise symmetric. Ossicle and degenerative change medially, similar compared to prior. Moderate plantar calcaneal spur. IMPRESSION: 1. Questionable lucency/fracture within the anterior inferior calcaneus on lateral view 2. No acute fracture or malalignment at the ankle Electronically Signed   By: Jasmine Pang M.D.   On: 06/01/2017 18:49    Assessment & Plan:   Scott Thomas was seen today for hypertension and diabetes.  Diagnoses and all orders for this visit:  Need for vaccination -     Pneumococcal conjugate vaccine 20-valent  Hypertension, accelerated- He has not achieved his blood pressure goal of 130/80.  Will continue the current antihypertensives and he will continue working on his lifestyle modifications. -     Basic metabolic panel; Future -     Basic metabolic panel  Type II diabetes mellitus with manifestations (HCC)- His A1c is up to 8.1%.  Will add a GLP-1 agonist. -     Hemoglobin A1c; Future -     Basic metabolic panel; Future -     Basic metabolic panel -     Hemoglobin A1c -     Continuous Blood Gluc Sensor (FREESTYLE LIBRE 3 SENSOR) MISC; 1 Act by Does not apply route daily. Place 1 sensor on the skin every 14 days. Use to check glucose continuously -     Semaglutide (RYBELSUS) 3 MG TABS; Take 3 mg by mouth daily.   I have discontinued Scott Thomas's FreeStyle Libre 2 Sensor. I am also  having him start on FreeStyle Libre 3 Sensor and Rybelsus. Additionally, I am having him maintain his buPROPion, sertraline, rosuvastatin, amLODipine, pantoprazole, triamterene-hydrochlorothiazide, Insulin Pen Needle, FreeStyle Libre 2 Reader, and metFORMIN.  Meds ordered this encounter  Medications   Continuous Blood Gluc Sensor (FREESTYLE LIBRE 3 SENSOR) MISC    Sig: 1 Act by  Does not apply route daily. Place 1 sensor on the skin every 14 days. Use to check glucose continuously    Dispense:  2 each    Refill:  5   Semaglutide (RYBELSUS) 3 MG TABS    Sig: Take 3 mg by mouth daily.    Dispense:  300 tablet    Refill:  0     Follow-up: Return in about 6 months (around 04/04/2023).  Sanda Linger, MD

## 2022-10-04 ENCOUNTER — Encounter: Payer: Self-pay | Admitting: Internal Medicine

## 2022-10-05 MED ORDER — RYBELSUS 3 MG PO TABS: 3.0000 mg | ORAL_TABLET | Freq: Every day | ORAL | 0 refills | Status: DC

## 2022-10-14 ENCOUNTER — Other Ambulatory Visit: Payer: Self-pay | Admitting: Internal Medicine

## 2022-10-14 ENCOUNTER — Other Ambulatory Visit: Payer: Self-pay | Admitting: Critical Care Medicine

## 2022-10-14 DIAGNOSIS — I10 Essential (primary) hypertension: Secondary | ICD-10-CM

## 2022-10-14 DIAGNOSIS — K219 Gastro-esophageal reflux disease without esophagitis: Secondary | ICD-10-CM

## 2022-10-16 ENCOUNTER — Other Ambulatory Visit: Payer: Self-pay | Admitting: Internal Medicine

## 2022-10-16 DIAGNOSIS — K219 Gastro-esophageal reflux disease without esophagitis: Secondary | ICD-10-CM

## 2022-10-16 MED ORDER — PANTOPRAZOLE SODIUM 40 MG PO TBEC: 40.0000 mg | DELAYED_RELEASE_TABLET | Freq: Every day | ORAL | 0 refills | Status: DC

## 2022-10-17 MED ORDER — BUPROPION HCL ER (XL) 150 MG PO TB24: 150.0000 mg | ORAL_TABLET | Freq: Every day | ORAL | Status: DC

## 2022-12-19 ENCOUNTER — Telehealth: Payer: Self-pay | Admitting: Nurse Practitioner

## 2022-12-19 DIAGNOSIS — B9789 Other viral agents as the cause of diseases classified elsewhere: Secondary | ICD-10-CM

## 2022-12-19 DIAGNOSIS — J0140 Acute pansinusitis, unspecified: Secondary | ICD-10-CM

## 2022-12-19 DIAGNOSIS — J329 Chronic sinusitis, unspecified: Secondary | ICD-10-CM

## 2022-12-19 MED ORDER — IPRATROPIUM BROMIDE 0.03 % NA SOLN: 2.0000 | Freq: Two times a day (BID) | NASAL | 12 refills | Status: AC

## 2022-12-19 MED ORDER — AMOXICILLIN-POT CLAVULANATE 875-125 MG PO TABS: 1.0000 | ORAL_TABLET | Freq: Two times a day (BID) | ORAL | 0 refills | Status: AC

## 2022-12-19 NOTE — Progress Notes (Signed)
E-Visit for Sinus Problems  We are sorry that you are not feeling well.  Here is how we plan to help!  Based on what you have shared with me it looks like you have sinusitis.  Sinusitis is inflammation and infection in the sinus cavities of the head.  Based on your presentation I believe you most likely have Acute Viral Sinusitis.This is an infection most likely caused by a virus. There is not specific treatment for viral sinusitis other than to help you with the symptoms until the infection runs its course.  You may use an oral decongestant such as Mucinex D or if you have glaucoma or high blood pressure use plain Mucinex. Saline nasal spray help and can safely be used as often as needed for congestion, I have prescribed: Ipratropium Bromide nasal spray 0.03% 2 sprays in eah nostril 2-3 times a day  Providers prescribe antibiotics to treat infections caused by bacteria. Antibiotics are very powerful in treating bacterial infections when they are used properly. To maintain their effectiveness, they should be used only when necessary. Overuse of antibiotics has resulted in the development of superbugs that are resistant to treatment!    After careful review of your answers, I would not recommend an antibiotic for your condition.  Antibiotics are not effective against viruses and therefore should not be used to treat them. Common examples of infections caused by viruses include colds and flu   We do not typically recommend antibiotics prior to 7-10 days of symptoms.   Some authorities believe that zinc sprays or the use of Echinacea may shorten the course of your symptoms.  Sinus infections are not as easily transmitted as other respiratory infection, however we still recommend that you avoid close contact with loved ones, especially the very young and elderly.  Remember to wash your hands thoroughly throughout the day as this is the number one way to prevent the spread of infection!  Home Care: Only  take medications as instructed by your medical team. Do not take these medications with alcohol. A steam or ultrasonic humidifier can help congestion.  You can place a towel over your head and breathe in the steam from hot water coming from a faucet. Avoid close contacts especially the very young and the elderly. Cover your mouth when you cough or sneeze. Always remember to wash your hands.  Get Help Right Away If: You develop worsening fever or sinus pain. You develop a severe head ache or visual changes. Your symptoms persist after you have completed your treatment plan.  Make sure you Understand these instructions. Will watch your condition. Will get help right away if you are not doing well or get worse.   Thank you for choosing an e-visit.  Your e-visit answers were reviewed by a board certified advanced clinical practitioner to complete your personal care plan. Depending upon the condition, your plan could have included both over the counter or prescription medications.  Please review your pharmacy choice. Make sure the pharmacy is open so you can pick up prescription now. If there is a problem, you may contact your provider through [employer] and have the prescription routed to another pharmacy.  Your safety is important to [place]. If you have drug allergies check your prescription carefully.   For the next 24 hours you can use MyChart to ask questions about today's visit, request a non-urgent call back, or ask for a work or school excuse. You will get an email in the next two days  asking about your experience. I hope that your e-visit has been valuable and will speed your recovery.  Meds ordered this encounter  Medications   ipratropium (ATROVENT) 0.03 % nasal spray    Sig: Place 2 sprays into both nostrils every 12 (twelve) hours.    Dispense:  30 mL    Refill:  12     I spent approximately 5 minutes reviewing the patient's history, current symptoms and coordinating  their care today.

## 2022-12-19 NOTE — Addendum Note (Signed)
Addended by: Apolonio Schneiders E on: 12/19/2022 07:02 PM   Modules accepted: Orders

## 2023-01-03 ENCOUNTER — Other Ambulatory Visit: Payer: Self-pay | Admitting: Critical Care Medicine

## 2023-01-08 ENCOUNTER — Other Ambulatory Visit: Payer: Self-pay | Admitting: Internal Medicine

## 2023-01-08 ENCOUNTER — Other Ambulatory Visit: Payer: Self-pay | Admitting: Critical Care Medicine

## 2023-01-08 DIAGNOSIS — I10 Essential (primary) hypertension: Secondary | ICD-10-CM

## 2023-01-08 MED ORDER — AMLODIPINE BESYLATE 10 MG PO TABS: 10.0000 mg | ORAL_TABLET | Freq: Every day | ORAL | 0 refills | Status: DC

## 2023-01-08 MED ORDER — TRIAMTERENE-HCTZ 37.5-25 MG PO CAPS: 1.0000 | ORAL_CAPSULE | Freq: Every day | ORAL | 0 refills | Status: DC

## 2023-01-09 ENCOUNTER — Other Ambulatory Visit: Payer: Self-pay | Admitting: Critical Care Medicine

## 2023-01-10 ENCOUNTER — Other Ambulatory Visit: Payer: Self-pay

## 2023-01-13 ENCOUNTER — Telehealth: Payer: Self-pay | Admitting: Nurse Practitioner

## 2023-01-13 DIAGNOSIS — F410 Panic disorder [episodic paroxysmal anxiety] without agoraphobia: Secondary | ICD-10-CM

## 2023-01-13 DIAGNOSIS — F411 Generalized anxiety disorder: Secondary | ICD-10-CM

## 2023-01-13 MED ORDER — SERTRALINE HCL 100 MG PO TABS: 100.0000 mg | ORAL_TABLET | Freq: Every day | ORAL | 0 refills | Status: DC

## 2023-01-13 MED ORDER — BUPROPION HCL ER (XL) 150 MG PO TB24: 150.0000 mg | ORAL_TABLET | Freq: Every day | ORAL | 0 refills | Status: DC

## 2023-01-13 NOTE — Progress Notes (Signed)
Your medications have been sent for 30days. Please follow up with Dr. Ronnald Ramp for future refills. It looks like your prescription requests have been going to Dr. Joya Gaskins and he is no longer your PCP.

## 2023-01-13 NOTE — Progress Notes (Signed)
I have spent 5 minutes in review of e-visit questionnaire, review and updating patient chart, medical decision making and response to patient.  ° °Pearly Apachito W Tamarick Kovalcik, NP ° °  °

## 2023-02-02 ENCOUNTER — Other Ambulatory Visit: Payer: Self-pay | Admitting: Internal Medicine

## 2023-02-02 DIAGNOSIS — E118 Type 2 diabetes mellitus with unspecified complications: Secondary | ICD-10-CM

## 2023-02-09 ENCOUNTER — Other Ambulatory Visit: Payer: Self-pay | Admitting: Internal Medicine

## 2023-02-09 DIAGNOSIS — K219 Gastro-esophageal reflux disease without esophagitis: Secondary | ICD-10-CM

## 2023-02-20 ENCOUNTER — Encounter: Payer: Self-pay | Admitting: Internal Medicine

## 2023-03-04 ENCOUNTER — Other Ambulatory Visit: Payer: Self-pay | Admitting: Internal Medicine

## 2023-03-04 DIAGNOSIS — F410 Panic disorder [episodic paroxysmal anxiety] without agoraphobia: Secondary | ICD-10-CM

## 2023-03-04 DIAGNOSIS — F411 Generalized anxiety disorder: Secondary | ICD-10-CM

## 2023-03-04 DIAGNOSIS — E118 Type 2 diabetes mellitus with unspecified complications: Secondary | ICD-10-CM

## 2023-03-05 MED ORDER — RYBELSUS 3 MG PO TABS
3.00 mg | ORAL_TABLET | Freq: Every day | ORAL | 0 refills | Status: DC
Start: 2023-03-05 — End: 2023-04-23

## 2023-03-05 MED ORDER — BUPROPION HCL ER (XL) 150 MG PO TB24: 150.0000 mg | ORAL_TABLET | Freq: Every day | ORAL | 0 refills | Status: DC

## 2023-03-05 MED ORDER — SERTRALINE HCL 100 MG PO TABS
100.0000 mg | ORAL_TABLET | Freq: Every day | ORAL | 0 refills | Status: DC
Start: 2023-03-05 — End: 2023-03-06

## 2023-03-06 ENCOUNTER — Other Ambulatory Visit: Payer: Self-pay | Admitting: Internal Medicine

## 2023-03-06 DIAGNOSIS — F410 Panic disorder [episodic paroxysmal anxiety] without agoraphobia: Secondary | ICD-10-CM

## 2023-03-07 MED ORDER — BUPROPION HCL ER (XL) 150 MG PO TB24: 150.0000 mg | ORAL_TABLET | Freq: Every day | ORAL | 0 refills | Status: DC

## 2023-03-07 MED ORDER — SERTRALINE HCL 100 MG PO TABS
100.0000 mg | ORAL_TABLET | Freq: Every day | ORAL | 0 refills | Status: DC
Start: 2023-03-07 — End: 2023-04-23

## 2023-03-27 ENCOUNTER — Other Ambulatory Visit: Payer: Self-pay | Admitting: Internal Medicine

## 2023-03-27 DIAGNOSIS — E118 Type 2 diabetes mellitus with unspecified complications: Secondary | ICD-10-CM

## 2023-04-08 ENCOUNTER — Ambulatory Visit: Payer: Self-pay | Admitting: Internal Medicine

## 2023-04-08 ENCOUNTER — Encounter: Payer: Self-pay | Admitting: Internal Medicine

## 2023-04-20 ENCOUNTER — Other Ambulatory Visit: Payer: Self-pay | Admitting: Internal Medicine

## 2023-04-20 DIAGNOSIS — I10 Essential (primary) hypertension: Secondary | ICD-10-CM

## 2023-04-23 ENCOUNTER — Ambulatory Visit (INDEPENDENT_AMBULATORY_CARE_PROVIDER_SITE_OTHER): Payer: Self-pay | Admitting: Internal Medicine

## 2023-04-23 ENCOUNTER — Encounter: Payer: Self-pay | Admitting: Internal Medicine

## 2023-04-23 VITALS — BP 142/90 | HR 82 | Temp 97.9°F | Ht >= 80 in | Wt 274.0 lb

## 2023-04-23 DIAGNOSIS — F410 Panic disorder [episodic paroxysmal anxiety] without agoraphobia: Secondary | ICD-10-CM

## 2023-04-23 DIAGNOSIS — Z794 Long term (current) use of insulin: Secondary | ICD-10-CM

## 2023-04-23 DIAGNOSIS — F411 Generalized anxiety disorder: Secondary | ICD-10-CM

## 2023-04-23 DIAGNOSIS — E118 Type 2 diabetes mellitus with unspecified complications: Secondary | ICD-10-CM

## 2023-04-23 DIAGNOSIS — I10 Essential (primary) hypertension: Secondary | ICD-10-CM

## 2023-04-23 DIAGNOSIS — E785 Hyperlipidemia, unspecified: Secondary | ICD-10-CM

## 2023-04-23 LAB — CBC WITH DIFFERENTIAL/PLATELET
Basophils Absolute: 0 10*3/uL (ref 0.0–0.1)
Basophils Relative: 0.7 % (ref 0.0–3.0)
Eosinophils Absolute: 0.1 10*3/uL (ref 0.0–0.7)
Eosinophils Relative: 1.6 % (ref 0.0–5.0)
HCT: 46.8 % (ref 39.0–52.0)
Hemoglobin: 15.4 g/dL (ref 13.0–17.0)
Lymphocytes Relative: 36.6 % (ref 12.0–46.0)
Lymphs Abs: 1.4 10*3/uL (ref 0.7–4.0)
MCHC: 33 g/dL (ref 30.0–36.0)
MCV: 86.8 fl (ref 78.0–100.0)
Monocytes Absolute: 0.4 10*3/uL (ref 0.1–1.0)
Monocytes Relative: 9.4 % (ref 3.0–12.0)
Neutro Abs: 2 10*3/uL (ref 1.4–7.7)
Neutrophils Relative %: 51.7 % (ref 43.0–77.0)
Platelets: 252 10*3/uL (ref 150.0–400.0)
RBC: 5.39 Mil/uL (ref 4.22–5.81)
RDW: 14 % (ref 11.5–15.5)
WBC: 3.9 10*3/uL — ABNORMAL LOW (ref 4.0–10.5)

## 2023-04-23 LAB — BASIC METABOLIC PANEL
BUN: 10 mg/dL (ref 6–23)
CO2: 29 mEq/L (ref 19–32)
Calcium: 9.9 mg/dL (ref 8.4–10.5)
Chloride: 100 mEq/L (ref 96–112)
Creatinine, Ser: 1 mg/dL (ref 0.40–1.50)
GFR: 92.85 mL/min (ref >60.00)
Glucose, Bld: 204 mg/dL — ABNORMAL HIGH (ref 70–99)
Potassium: 4.3 mEq/L (ref 3.5–5.1)
Sodium: 136 mEq/L (ref 135–145)

## 2023-04-23 LAB — HEMOGLOBIN A1C: Hgb A1c MFr Bld: 8.2 % — ABNORMAL HIGH (ref 4.6–6.5)

## 2023-04-23 MED ORDER — ROSUVASTATIN CALCIUM 5 MG PO TABS
5.0000 mg | ORAL_TABLET | Freq: Every day | ORAL | 1 refills | Status: AC
Start: 2023-04-23 — End: ?

## 2023-04-23 MED ORDER — AMLODIPINE BESYLATE 10 MG PO TABS: 10.0000 mg | ORAL_TABLET | Freq: Every day | ORAL | 0 refills | Status: DC

## 2023-04-23 MED ORDER — ALPRAZOLAM 0.25 MG PO TABS
0.2500 mg | ORAL_TABLET | Freq: Three times a day (TID) | ORAL | 3 refills | Status: AC | PRN
Start: 2023-04-23 — End: ?

## 2023-04-23 MED ORDER — TOUJEO MAX SOLOSTAR 300 UNIT/ML ~~LOC~~ SOPN
20.0000 [IU] | PEN_INJECTOR | Freq: Every day | SUBCUTANEOUS | 1 refills | Status: DC
Start: 2023-04-23 — End: 2023-11-14

## 2023-04-23 MED ORDER — METFORMIN HCL ER 750 MG PO TB24
1500.0000 mg | ORAL_TABLET | Freq: Every day | ORAL | 1 refills | Status: DC
Start: 2023-04-23 — End: 2023-06-07

## 2023-04-23 MED ORDER — INSULIN PEN NEEDLE 32G X 6 MM MISC
1.0000 | Freq: Every day | 1 refills | Status: DC
Start: 2023-04-23 — End: 2023-11-14

## 2023-04-23 MED ORDER — TRIAMTERENE-HCTZ 37.5-25 MG PO CAPS
1.0000 | ORAL_CAPSULE | Freq: Every day | ORAL | 0 refills | Status: DC
Start: 2023-04-23 — End: 2023-07-22

## 2023-04-23 MED ORDER — FREESTYLE LIBRE 3 SENSOR MISC
5 refills | Status: DC
Start: 2023-04-23 — End: 2023-05-09

## 2023-04-23 MED ORDER — RYBELSUS 7 MG PO TABS
7.0000 mg | ORAL_TABLET | Freq: Every day | ORAL | 0 refills | Status: DC
Start: 2023-04-23 — End: 2023-08-01

## 2023-04-23 MED ORDER — SERTRALINE HCL 100 MG PO TABS
100.00 mg | ORAL_TABLET | Freq: Every day | ORAL | 1 refills | Status: AC
Start: 2023-04-23 — End: ?

## 2023-04-23 NOTE — Progress Notes (Signed)
Subjective:  Patient ID: Scott Thomas, male    DOB: 1981/09/08  Age: 42 y.o. MRN: [id number]  CC: Hypertension and Diabetes   HPI WHEELER INCORVAIA presents for f/up ----  Discussed the use of AI scribe software for clinical note transcription with the patient, who gave verbal consent to proceed.  History of Present Illness   The patient, with a history of anxiety, PTSD, and diabetes, reports an increase in panic attacks due to recent familial stressors. They have previously been prescribed Xanax 0.25mg  as needed for these episodes, which they describe as feeling like heart attacks. They request a renewal of this prescription, stating that they do not need to take it daily, but would like to have it available if needed.  The patient also reports that they have been taking Metformin and Rybelsus 3mg , the latter of which was provided as a six-month sample. Initially, the Rybelsus was effective, but the patient has noticed a decrease in efficacy over time. They have read that the 3mg  dose is an introductory dose and believe they may benefit from an increase to 7mg . They deny any symptoms of high blood pressure such as headaches or blurred vision.  Regarding their diabetes management, the patient reports that most of their blood glucose readings are less than 250, with 8% being above 250. They deny any symptoms of hyperglycemia such as excessive thirst or urination. They have intentionally lost weight through increased physical activity, including walking two miles regularly with their daughter. They deny any chest pain or shortness of breath during these walks.       Outpatient Medications Prior to Visit  Medication Sig Dispense Refill   Continuous Blood Gluc Receiver (FREESTYLE LIBRE 2 READER) DEVI 1 Act by Does not apply route daily. 2 each 5   ipratropium (ATROVENT) 0.03 % nasal spray Place 2 sprays into both nostrils every 12 (twelve) hours. 30 mL 12   pantoprazole (PROTONIX) 40 MG  tablet Take 1 tablet by mouth once daily 90 tablet 0   amLODipine (NORVASC) 10 MG tablet Take 1 tablet (10 mg total) by mouth daily. 90 tablet 0   buPROPion (WELLBUTRIN XL) 150 MG 24 hr tablet Take 1 tablet (150 mg total) by mouth daily. Must keep 04/08/23 appt for future refills 30 tablet 0   Continuous Glucose Sensor (FREESTYLE LIBRE 3 SENSOR) MISC PLACE 1 SENSOR ON THE SKIN EVERY 14 DAYS. USE TO CHECK GLUCOSE CONTINUOUSLY 2 each 0   metFORMIN (GLUCOPHAGE-XR) 750 MG 24 hr tablet Take 1 tablet by mouth once daily with breakfast 90 tablet 0   rosuvastatin (CRESTOR) 5 MG tablet Take 1 tablet (5 mg total) by mouth daily. 90 tablet 1   Semaglutide (RYBELSUS) 3 MG TABS Take 1 tablet (3 mg total) by mouth daily. Must keep 04/08/23 appt for future refills 30 tablet 0   sertraline (ZOLOFT) 100 MG tablet Take 1 tablet (100 mg total) by mouth daily. Must keep 04/08/23 appt for future refills 30 tablet 0   triamterene-hydrochlorothiazide (DYAZIDE) 37.5-25 MG capsule Take 1 each (1 capsule total) by mouth daily. 90 capsule 0   No facility-administered medications prior to visit.    ROS Review of Systems  Constitutional: Negative.  Negative for appetite change, chills, diaphoresis, fatigue and unexpected weight change.  HENT: Negative.    Eyes: Negative.   Respiratory:  Negative for chest tightness, shortness of breath and wheezing.   Cardiovascular:  Negative for chest pain, palpitations and leg swelling.  Gastrointestinal:  Negative  for abdominal pain, constipation, diarrhea, nausea and vomiting.  Endocrine: Negative.   Genitourinary: Negative.  Negative for difficulty urinating and dysuria.  Musculoskeletal: Negative.  Negative for arthralgias and myalgias.  Skin: Negative.   Neurological:  Negative for dizziness, weakness, light-headedness and numbness.  Hematological:  Negative for adenopathy. Does not bruise/bleed easily.  Psychiatric/Behavioral: Negative.      Objective:  BP (!) 142/90 (BP  Location: Left Arm, Patient Position: Sitting, Cuff Size: Large)   Pulse 82   Temp 97.9 F (36.6 C) (Oral)   Ht 6\' 10"  (2.083 m)   Wt 274 lb (124.3 kg)   SpO2 97%   BMI 28.65 kg/m   BP Readings from Last 3 Encounters:  04/23/23 (!) 142/90  10/03/22 138/88  07/17/22 (!) 170/100    Wt Readings from Last 3 Encounters:  04/23/23 274 lb (124.3 kg)  10/03/22 (!) 302 lb 9.6 oz (137.3 kg)  07/17/22 (!) 308 lb (139.7 kg)    Physical Exam Vitals reviewed.  HENT:     Nose: Nose normal.     Mouth/Throat:     Mouth: Mucous membranes are moist.  Eyes:     General: No scleral icterus.    Conjunctiva/sclera: Conjunctivae normal.  Cardiovascular:     Rate and Rhythm: Normal rate and regular rhythm.     Heart sounds: No murmur heard.    No friction rub. No gallop.  Pulmonary:     Effort: Pulmonary effort is normal.     Breath sounds: No stridor. No wheezing, rhonchi or rales.  Abdominal:     General: Abdomen is flat.     Palpations: There is no mass.     Tenderness: There is no abdominal tenderness. There is no guarding.     Hernia: No hernia is present.  Musculoskeletal:        General: Normal range of motion.     Cervical back: Neck supple.     Right lower leg: No edema.     Left lower leg: No edema.  Lymphadenopathy:     Cervical: No cervical adenopathy.  Skin:    General: Skin is warm and dry.  Neurological:     General: No focal deficit present.     Mental Status: He is alert. Mental status is at baseline.  Psychiatric:        Mood and Affect: Mood normal.        Behavior: Behavior normal.     Lab Results  Component Value Date   WBC 3.9 (L) 04/23/2023   HGB 15.4 04/23/2023   HCT 46.8 04/23/2023   PLT 252.0 04/23/2023   GLUCOSE 204 (H) 04/23/2023   CHOL 152 06/25/2022   TRIG 42 06/25/2022   HDL 48 06/25/2022   LDLCALC 92 06/25/2022   ALT 24 06/25/2022   AST 13 (A) 06/25/2022   NA 136 04/23/2023   K 4.3 04/23/2023   CL 100 04/23/2023   CREATININE 1.00  04/23/2023   BUN 10 04/23/2023   CO2 29 04/23/2023   TSH 1.09 07/17/2022   PSA 0.86 07/17/2022   HGBA1C 8.2 (H) 04/23/2023   MICROALBUR 1.8 07/17/2022    DG Ankle Complete Left  Result Date: 06/01/2017 CLINICAL DATA:  Stepped in a hole, lateral pain EXAM: LEFT ANKLE COMPLETE - 3+ VIEW COMPARISON:  06/11/2010 FINDINGS: No malalignment. Questionable lucency anterior calcaneus on lateral view. Ankle mortise symmetric. Ossicle and degenerative change medially, similar compared to prior. Moderate plantar calcaneal spur. IMPRESSION: 1. Questionable lucency/fracture within the anterior  inferior calcaneus on lateral view 2. No acute fracture or malalignment at the ankle Electronically Signed   By: Jasmine Pang M.D.   On: 06/01/2017 18:49    Assessment & Plan:   Hypertension, accelerated- Will try to get better control of his blood pressure. -     Basic metabolic panel; Future -     CBC with Differential/Platelet; Future -     amLODIPine Besylate; Take 1 tablet (10 mg total) by mouth daily.  Dispense: 90 tablet; Refill: 0 -     Triamterene-HCTZ; Take 1 each (1 capsule total) by mouth daily.  Dispense: 90 capsule; Refill: 0  Type II diabetes mellitus with manifestations (HCC) -     Basic metabolic panel; Future -     Hemoglobin A1c; Future -     FreeStyle Libre 3 Sensor; PLACE 1 SENSOR ON THE SKIN EVERY 14 DAYS. USE TO CHECK GLUCOSE CONTINUOUSLY  Dispense: 2 each; Refill: 5 -     Rybelsus; Take 1 tablet (7 mg total) by mouth daily.  Dispense: 90 tablet; Refill: 0 -     Ambulatory referral to Ophthalmology -     metFORMIN HCl ER; Take 2 tablets (1,500 mg total) by mouth daily with breakfast.  Dispense: 90 tablet; Refill: 1  Panic anxiety syndrome -     ALPRAZolam; Take 1 tablet (0.25 mg total) by mouth 3 (three) times daily as needed for anxiety.  Dispense: 90 tablet; Refill: 3 -     Sertraline HCl; Take 1 tablet (100 mg total) by mouth daily. Must keep 04/08/23 appt for future refills   Dispense: 90 tablet; Refill: 1  Hyperlipidemia LDL goal <70 -     Rosuvastatin Calcium; Take 1 tablet (5 mg total) by mouth daily.  Dispense: 90 tablet; Refill: 1  Generalized anxiety disorder with panic attacks -     Sertraline HCl; Take 1 tablet (100 mg total) by mouth daily. Must keep 04/08/23 appt for future refills  Dispense: 90 tablet; Refill: 1  Insulin-requiring or dependent type II diabetes mellitus (HCC)- Will try to get better control of his blood sugar. -     Toujeo Max SoloStar; Inject 20 Units into the skin daily.  Dispense: 6 mL; Refill: 1 -     Insulin Pen Needle; 1 Act by Does not apply route daily.  Dispense: 100 each; Refill: 1     Follow-up: Return in about 6 months (around 10/24/2023).  Sanda Linger, MD

## 2023-04-23 NOTE — Patient Instructions (Signed)

## 2023-04-24 ENCOUNTER — Other Ambulatory Visit (HOSPITAL_COMMUNITY): Payer: Self-pay

## 2023-04-25 ENCOUNTER — Telehealth: Payer: Self-pay

## 2023-04-25 NOTE — Progress Notes (Signed)
   04/25/2023  Patient ID: Scott Thomas, male   DOB: [birth year], 42 y.o.   MRN: [id number]  Patient outreach to schedule medication review per request from PCP, Dr. Yetta Barre.  I was unable to reach the patient or leave a voicemail.  Sending a MyChart message and will attempt to contact again next week if I do not hear back from patient.  Lenna Gilford, PharmD, DPLA

## 2023-04-29 ENCOUNTER — Encounter: Payer: Self-pay | Admitting: Internal Medicine

## 2023-04-30 ENCOUNTER — Other Ambulatory Visit: Payer: Self-pay | Admitting: Internal Medicine

## 2023-05-01 ENCOUNTER — Telehealth: Payer: Self-pay

## 2023-05-01 ENCOUNTER — Other Ambulatory Visit (HOSPITAL_COMMUNITY): Payer: Self-pay

## 2023-05-01 NOTE — Telephone Encounter (Signed)
Submitted application for RYBELSUS to [employer] for patient assistance via online portal.   Phone: [phone]  ________  Mailed [employer] application to patients home - for [facility] assistance.

## 2023-05-02 ENCOUNTER — Other Ambulatory Visit: Payer: Self-pay

## 2023-05-02 NOTE — Progress Notes (Unsigned)
05/02/2023 Name: Scott Thomas MRN: [id number] DOB: 07-Aug-1981  Chief Complaint  Patient presents with   Medication Management   Scott Thomas is a 42 y.o. year old male who presented for a telephone visit.   They were referred to the pharmacist by their PCP for assistance in managing diabetes.   Subjective:  Care Team: Primary Care Provider: Etta Grandchild, MD  Medication Access/Adherence Current Pharmacy:  [facility] [address] [place] - [address] [place] [address] Phone: [phone] Fax: [phone]  -Patient reports affordability concerns with their medications: Yes -No insurance -Patient reports access/transportation concerns to their pharmacy: No  -Patient reports adherence concerns with their medications:  Yes    Diabetes: Current medications: Toujeo Max 20 units daily, Metformin XR 1500mg  daily, Rybelsus 7mg  daily -Patient was taking Rybelsus 3mg  daily (samples) and Metformin XR 750mg  daily; because he was unable to afford Toujeo; but he recently got a copay card that took price to $35, and he is now using 20 units daily. -He desires to stop taking Metformin all together due to stomach upset  -FBG this morning 188, but he states he has not yet taken Toujeo injection -Previously using Freestyle Libre for CGM but has had a hard time keeping the sensors on and would like to try Dexcom  Objective: Lab Results  Component Value Date   HGBA1C 8.2 (H) 04/23/2023   Medications Reviewed Today     Reviewed by Lenna Gilford, RPH (Pharmacist) on 05/02/23 at [phone]  Med List Status: <None>   Medication Order Taking? Sig Documenting Provider Last Dose Status Informant  ALPRAZolam (XANAX) 0.25 MG tablet [id number] Yes Take 1 tablet (0.25 mg total) by mouth 3 (three) times daily as needed for anxiety. Etta Grandchild, MD Taking Active   amLODipine (NORVASC) 10 MG tablet [id number] Yes Take 1 tablet (10 mg total) by mouth  daily. Etta Grandchild, MD Taking Active   Continuous Glucose Sensor (FREESTYLE LIBRE 3 SENSOR) [place] [id number] Yes PLACE 1 SENSOR ON THE SKIN EVERY 14 DAYS. USE TO CHECK GLUCOSE CONTINUOUSLY Etta Grandchild, MD Taking Active   insulin glargine, 2 Unit Dial, (TOUJEO MAX SOLOSTAR) 300 UNIT/ML Solostar Pen [id number] Yes Inject 20 Units into the skin daily. Etta Grandchild, MD Taking Active   Insulin Pen Needle 32G X 6 MM MISC [id number] Yes 1 Act by Does not apply route daily. Etta Grandchild, MD Taking Active   ipratropium (ATROVENT) 0.03 % nasal spray [id number] Yes Place 2 sprays into both nostrils every 12 (twelve) hours. Viviano Simas, FNP Taking Active   metFORMIN (GLUCOPHAGE-XR) 750 MG 24 hr tablet [id number] Yes Take 2 tablets (1,500 mg total) by mouth daily with breakfast. Etta Grandchild, MD Taking Active            Med Note Littie Deeds, [facility] A   Thu May 02, 2023  9:10 AM) Taking 1 daily  pantoprazole (PROTONIX) 40 MG tablet [id number] Yes Take 1 tablet by mouth once daily Etta Grandchild, MD Taking Active   rosuvastatin (CRESTOR) 5 MG tablet [id number] Yes Take 1 tablet (5 mg total) by mouth daily. Etta Grandchild, MD Taking Active   Semaglutide [facility]) 7 MG TABS [id number] No Take 1 tablet (7 mg total) by mouth daily.  Patient not taking: Reported on 05/02/2023   Etta Grandchild, MD Not Taking Active            Med Note (Gabryel Talamo, [facility]  A   Thu May 02, 2023  9:10 AM) Using 3mg  sample currently  sertraline (ZOLOFT) 100 MG tablet [id number] Yes Take 1 tablet (100 mg total) by mouth daily. Must keep 04/08/23 appt for future refills Etta Grandchild, MD Taking Active   triamterene-hydrochlorothiazide (DYAZIDE) 37.5-25 MG capsule [id number] Yes Take 1 each (1 capsule total) by mouth daily. Etta Grandchild, MD Taking Active   Med List Note Domingo Mend, CPhT 03/28/21 1437): [facility]. Patient has not been seen in office since 10/6. Needs office visit  for more refills           Assessment/Plan:   Diabetes: - Currently uncontrolled -Application for Rybelsus PAP through [employer] was submitted yesterday electronically, and an application for Toujeo PAP was mailed to patient's home -Sending prescription to pharmacy for Dexcom G7 sensors under [facility] standing order.  Including coupon card information to assist with affordability. -Patient will continue Rybelsus 3mg  daily, hold metformin, and increase Toujeo to 22 units daily- if FBG remains >130 in three days, increase to 24 units daily -He currently has 2 weeks remaining of Rybelsus 3mg  samples, so I will see if the office has another sample he can get until PAP decision is known.  Follow Up Plan: Telephone visit in 1 week  Lenna Gilford, PharmD, DPLA

## 2023-05-09 ENCOUNTER — Other Ambulatory Visit: Payer: Self-pay

## 2023-05-09 MED ORDER — DEXCOM G7 SENSOR MISC: 11 refills | Status: AC

## 2023-05-09 NOTE — Telephone Encounter (Signed)
Received notification from [employer] regarding approval for RYBELSUS. Patient assistance approved from 05/06/23 to 04/27/24.  Medication will ship to office in 10-14 business days.  Phone: [phone]

## 2023-05-19 ENCOUNTER — Other Ambulatory Visit: Payer: Self-pay | Admitting: Internal Medicine

## 2023-05-19 DIAGNOSIS — K219 Gastro-esophageal reflux disease without esophagitis: Secondary | ICD-10-CM

## 2023-05-20 MED ORDER — PANTOPRAZOLE SODIUM 40 MG PO TBEC
40.0000 mg | DELAYED_RELEASE_TABLET | Freq: Every day | ORAL | 1 refills | Status: AC
Start: 2023-05-20 — End: ?

## 2023-05-27 NOTE — Progress Notes (Unsigned)
   05/27/2023  Patient ID: Scott Thomas, male   DOB: 1981-08-17, 41 y.o.   MRN: [id number]  S/O Telephone follow-up to check on management of DM  Diabetes Management Plan -Patient has stopped metformin due to GI side effects  and is now taking 22 units of Toujeo and 3mg  Rybelsus daily -States FBG 138 -Rybelsus 7mg  approved through [employer] PAP,  and 4 mo supply will arrive in 10-14 days at Dr. Barnett Applebaum office  Diabetes Management Plan -Once patient starts Rybelsus 7mg  daily, decrease Toujeo back down to 20 units daily- may decrease by 2 units every 2-3 days if FBG <100 -if can get sample, start taking 2 3mg  tablets daily until gone, then start 7mg  -decrease toujeo back down to 20 units, can decrease by 2 units if FBG <100 **did he get toujeo PAP app?  Follow-up:  Will follow-up to make sure Rybelsus 7mg  received, see if patient was able to pick up Dexcom prescription, and check on status of Toujeo PAP application mailed  Lenna Gilford, PharmD, DPLA

## 2023-05-31 ENCOUNTER — Other Ambulatory Visit: Payer: Self-pay

## 2023-05-31 NOTE — Progress Notes (Signed)
06/07/2023 Name: Scott Thomas MRN: [id number] DOB: 1981-05-13  Chief Complaint  Patient presents with   Diabetes Management Plan   Scott Thomas is a 42 y.o. year old male who presented for a telephone visit to follow-up on medication access/adherence and diabetes control Subjective:  Care Team: Primary Care Provider: Etta Grandchild, MD   Medication Access/Adherence  Current Pharmacy:  [facility] [address] [place] - [address] [place] [address] Phone: [phone] Fax: [phone]  -Patient reports affordability concerns with their medications: No  -Patient reports access/transportation concerns to their pharmacy: No  -Patient reports adherence concerns with their medications:  No    Diabetes: Current medications: Toujeo 20 units daily, Rybelsus 7mg  daily  -Medications tried in the past: Cannot tolerate metformin due to very bad GI side effects -Current glucose readings: FBG 120 -Using Dexcom G7 for CGM; states it was more expensive that [place] but is more accurate -Patient denies hypoglycemic s/sx including dizziness, shakiness, sweating.  -Patient denies hyperglycemic symptoms including polyuria, polydipsia, polyphagia, nocturia, neuropathy, blurred vision. -He is not sure if he received the Toujeo PAP application, but he states this is affordable for him at this time -Current medication access support: [employer] PAP for Rybelsus -He is currently on 7mg  of Rybelsus and has been for about two weeks; states he is tolerating well and noticing decrease in appetite  Objective: Lab Results  Component Value Date   HGBA1C 8.2 (H) 04/23/2023   Medications Reviewed Today     Reviewed by Lenna Gilford, RPH (Pharmacist) on 06/07/23 at 1654  Med List Status: <None>   Medication Order Taking? Sig Documenting Provider Last Dose Status Informant  ALPRAZolam (XANAX) 0.25 MG tablet [id number] Yes Take 1 tablet (0.25 mg total) by  mouth 3 (three) times daily as needed for anxiety. Etta Grandchild, MD Taking Active   amLODipine (NORVASC) 10 MG tablet [id number] Yes Take 1 tablet (10 mg total) by mouth daily. Etta Grandchild, MD Taking Active   Continuous Glucose Sensor (DEXCOM G7 SENSOR) [place] [id number] Yes Change sensor every 10 days.  Pharmacy, please apply Dexcom savings:  bin [id number], PCN PDMI, Grp [id number], ID [email or web address] Etta Grandchild, MD Taking Active   insulin glargine, 2 Unit Dial, (TOUJEO MAX SOLOSTAR) 300 UNIT/ML Solostar Pen [id number] Yes Inject 20 Units into the skin daily.  Patient taking differently: Inject 22 Units into the skin daily.   Etta Grandchild, MD Taking Active   Insulin Pen Needle 32G X 6 MM MISC [id number] Yes 1 Act by Does not apply route daily. Etta Grandchild, MD Taking Active   ipratropium (ATROVENT) 0.03 % nasal spray [id number]  Place 2 sprays into both nostrils every 12 (twelve) hours. Viviano Simas, FNP  Active   pantoprazole (PROTONIX) 40 MG tablet [id number] Yes Take 1 tablet (40 mg total) by mouth daily. Etta Grandchild, MD Taking Active   rosuvastatin (CRESTOR) 5 MG tablet [id number] Yes Take 1 tablet (5 mg total) by mouth daily. Etta Grandchild, MD Taking Active   Semaglutide [facility]) 7 MG TABS [id number] Yes Take 1 tablet (7 mg total) by mouth daily. Etta Grandchild, MD Taking Active            Med Note Littie Deeds, [facility] A   Fri Jun 07, 2023  4:54 PM)    sertraline (ZOLOFT) 100 MG tablet [id number] Yes Take 1 tablet (100 mg total) by mouth daily. Must keep 04/08/23 appt for  future refills Etta Grandchild, MD Taking Active   triamterene-hydrochlorothiazide (DYAZIDE) 37.5-25 MG capsule [id number] Yes Take 1 each (1 capsule total) by mouth daily. Etta Grandchild, MD Taking Active   Med List Note Domingo Mend, CPhT 03/28/21 1437): [facility]. Patient has not been seen in office since 10/6. Needs office visit for more refills            Assessment/Plan:   Diabetes: - Currently uncontrolled based on last A1c value - Continue current regimen at this time and continuous monitoring of BG - Will follow up to discuss increasing Rybelsus to 14mg ; patient would like to come off of insulin, but this may require an additional oral agent to do so  Follow Up Plan: 9/9  Lenna Gilford, PharmD, DPLA

## 2023-06-18 ENCOUNTER — Telehealth: Payer: Self-pay | Admitting: Physician Assistant

## 2023-06-18 DIAGNOSIS — B9689 Other specified bacterial agents as the cause of diseases classified elsewhere: Secondary | ICD-10-CM

## 2023-06-18 DIAGNOSIS — J019 Acute sinusitis, unspecified: Secondary | ICD-10-CM

## 2023-06-18 MED ORDER — AMOXICILLIN-POT CLAVULANATE 875-125 MG PO TABS
1.0000 | ORAL_TABLET | Freq: Two times a day (BID) | ORAL | 0 refills | Status: DC
Start: 2023-06-18 — End: 2023-10-24

## 2023-06-18 NOTE — Progress Notes (Signed)

## 2023-06-18 NOTE — Progress Notes (Signed)
Message sent to patient requesting further input regarding current symptoms. Awaiting patient response.  

## 2023-06-18 NOTE — Progress Notes (Signed)
I have spent 5 minutes in review of e-visit questionnaire, review and updating patient chart, medical decision making and response to patient.   William Cody Martin, PA-C    

## 2023-06-21 NOTE — Telephone Encounter (Signed)
Messaged patient via mychart to see if [employer] application was ever rec'd.

## 2023-06-24 ENCOUNTER — Encounter: Payer: Self-pay | Admitting: Pharmacist

## 2023-06-24 ENCOUNTER — Other Ambulatory Visit: Payer: Self-pay | Admitting: Pharmacist

## 2023-06-24 DIAGNOSIS — E118 Type 2 diabetes mellitus with unspecified complications: Secondary | ICD-10-CM

## 2023-06-24 NOTE — Patient Instructions (Signed)
It was a pleasure speaking with you today!  As we discussed, continue current medication regimen and continue working on diet changes such as reduced portions of carbohydrates and working on pairing carbs with a fiber and/or protein source.  We will check your A1c on 07/24/23 and then I will call you 07/29/23 at 9:30 AM for follow up.  Feel free to message me or call me if there are any questions or concerns.  Arbutus Leas, PharmD, BCPS [facility] [phone]

## 2023-06-24 NOTE — Progress Notes (Signed)
06/24/2023 Name: Scott Thomas MRN: [id number] DOB: 08-07-1981  Chief Complaint  Patient presents with   Medication Management   BURRELL WIES is a 42 y.o. year old male who presented for a telephone visit to follow-up on medication access/adherence and diabetes control Subjective:  Care Team: Primary Care Provider: Etta Grandchild, MD  **No follow up currently scheduled  Medication Access/Adherence  Current Pharmacy:  [facility] [address], [place] - [address] [place] [address] Phone: [phone] Fax: [phone]  -Patient reports affordability concerns with their medications: No  -Patient reports access/transportation concerns to their pharmacy: No  -Patient reports adherence concerns with their medications:  No    Diabetes: Current medications: Toujeo 20 units daily, Rybelsus 7mg  daily (started 3-4 weeks ago) -Medications tried in the past: Cannot tolerate metformin due to very bad GI side effects -Current glucose readings: BG 120-150 through out the day per his Dexcom. FBG ~140. Post prandial typically 180-200. Pt states this is an improvement. -Using Dexcom G7 for CGM -Patient denies hypoglycemic s/sx including dizziness, shakiness, sweating.  -Patient denies hyperglycemic symptoms including polyuria, polydipsia, polyphagia, nocturia, neuropathy, blurred vision. -He is not sure if he received the Toujeo PAP application, but he states this is affordable for him at this time -Current medication access support: [employer] PAP for Rybelsus  Diet: working on reducing carb intake - main issue is portion sizes of potatoes. Limits sweets.   Objective: Lab Results  Component Value Date   HGBA1C 8.2 (H) 04/23/2023   Medications Reviewed Today     Reviewed by Bonita Quin, RPH (Pharmacist) on 06/24/23 at 1022  Med List Status: <None>   Medication Order Taking? Sig Documenting Provider Last Dose Status Informant   ALPRAZolam (XANAX) 0.25 MG tablet [id number]  Take 1 tablet (0.25 mg total) by mouth 3 (three) times daily as needed for anxiety. Etta Grandchild, MD  Active   amLODipine (NORVASC) 10 MG tablet [id number]  Take 1 tablet (10 mg total) by mouth daily. Etta Grandchild, MD  Active   amoxicillin-clavulanate (AUGMENTIN) 875-125 MG tablet [id number]  Take 1 tablet by mouth 2 (two) times daily. Waldon Merl, PA-C  Active   Continuous Glucose Sensor (DEXCOM G7 SENSOR) [place] [id number] Yes Change sensor every 10 days.  Pharmacy, please apply Dexcom savings:  bin [id number], PCN PDMI, Grp [id number], ID [email or web address] Etta Grandchild, MD Taking Active   insulin glargine, 2 Unit Dial, (TOUJEO MAX SOLOSTAR) 300 UNIT/ML Solostar Pen [id number] Yes Inject 20 Units into the skin daily. Etta Grandchild, MD Taking Active   Insulin Pen Needle 32G X 6 MM MISC [id number]  1 Act by Does not apply route daily. Etta Grandchild, MD  Active   ipratropium (ATROVENT) 0.03 % nasal spray [id number]  Place 2 sprays into both nostrils every 12 (twelve) hours. Viviano Simas, FNP  Active   pantoprazole (PROTONIX) 40 MG tablet [id number]  Take 1 tablet (40 mg total) by mouth daily. Etta Grandchild, MD  Active   rosuvastatin (CRESTOR) 5 MG tablet [id number]  Take 1 tablet (5 mg total) by mouth daily. Etta Grandchild, MD  Active   Semaglutide [facility]) 7 MG TABS [id number] Yes Take 1 tablet (7 mg total) by mouth daily. Etta Grandchild, MD Taking Active            Med Note Littie Deeds, [facility] A   Fri Jun 07, 2023  4:54 PM)    sertraline (  ZOLOFT) 100 MG tablet [id number]  Take 1 tablet (100 mg total) by mouth daily. Must keep 04/08/23 appt for future refills Etta Grandchild, MD  Active   triamterene-hydrochlorothiazide (DYAZIDE) 37.5-25 MG capsule [id number]  Take 1 each (1 capsule total) by mouth daily. Etta Grandchild, MD  Active   Med List Note Domingo Mend, CPhT 03/28/21 1437): [facility]. Patient has  not been seen in office since 10/6. Needs office visit for more refills           Assessment/Plan:   Diabetes: - Currently uncontrolled based on last A1c value, goal A1c <7.0% - Discussed dietary modifications to help BG control - Continue current regimen at this time and continuous monitoring of BG - Consider increasing Rybelsus to 14mg  if A1c remains >7.0%; patient would like to come off of insulin, but this may require an additional oral agent to do so   Follow Up Plan: 10/9 A1c, 10/14 telephone visit  Arbutus Leas, PharmD, BCPS [facility] [phone]

## 2023-07-12 ENCOUNTER — Telehealth: Payer: Self-pay | Admitting: Internal Medicine

## 2023-07-12 NOTE — Telephone Encounter (Signed)
We have received patient assistance pw and it has been placed in the providers box.   Please fax to: [phone]

## 2023-07-22 ENCOUNTER — Other Ambulatory Visit: Payer: Self-pay | Admitting: Internal Medicine

## 2023-07-22 ENCOUNTER — Encounter: Payer: Self-pay | Admitting: Pharmacist

## 2023-07-22 ENCOUNTER — Other Ambulatory Visit: Payer: Self-pay | Admitting: Pharmacist

## 2023-07-22 DIAGNOSIS — I10 Essential (primary) hypertension: Secondary | ICD-10-CM

## 2023-07-22 DIAGNOSIS — E118 Type 2 diabetes mellitus with unspecified complications: Secondary | ICD-10-CM

## 2023-07-29 ENCOUNTER — Other Ambulatory Visit: Payer: Self-pay

## 2023-07-30 ENCOUNTER — Other Ambulatory Visit (INDEPENDENT_AMBULATORY_CARE_PROVIDER_SITE_OTHER): Payer: Self-pay

## 2023-07-30 DIAGNOSIS — E118 Type 2 diabetes mellitus with unspecified complications: Secondary | ICD-10-CM

## 2023-07-30 LAB — HEMOGLOBIN A1C: Hgb A1c MFr Bld: 8.2 % — ABNORMAL HIGH (ref 4.6–6.5)

## 2023-08-01 ENCOUNTER — Other Ambulatory Visit: Payer: Self-pay | Admitting: Pharmacist

## 2023-08-01 DIAGNOSIS — E118 Type 2 diabetes mellitus with unspecified complications: Secondary | ICD-10-CM

## 2023-08-01 MED ORDER — RYBELSUS 14 MG PO TABS
14.0000 mg | ORAL_TABLET | Freq: Every day | ORAL | 1 refills | Status: AC
Start: 2023-08-01 — End: ?

## 2023-08-01 NOTE — Progress Notes (Signed)
   08/01/2023 Name: Scott Thomas MRN: [id number] DOB: 20-Jun-1981  No chief complaint on file.  Scott Thomas is a 42 y.o. year old male who presented for a telephone visit to follow-up on medication access/adherence and diabetes control  Subjective:  Care Team: Primary Care Provider: Etta Grandchild, MD  **No follow up currently scheduled  Medication Access/Adherence  Current Pharmacy:  [facility] [address] [place] - [address] [place] [address] Phone: [phone] Fax: [phone]  -Patient reports affordability concerns with their medications: No  -Patient reports access/transportation concerns to their pharmacy: No  -Patient reports adherence concerns with their medications:  No    Diabetes: Current medications: Toujeo 20 units daily, Rybelsus 7mg  daily (started ~8 weeks ago) -Medications tried in the past: Cannot tolerate metformin due to very bad GI side effects -Current glucose readings: BG less than 200 through out the day per his Dexcom. FBG ~140. Highest has been 220 one time. -Using Dexcom G7 for CGM -He is not sure if he received the Toujeo PAP application, but he states this is affordable for him at this time -Current medication access support: [employer] PAP for Rybelsus  Diet: working on reducing carb intake - main issue is portion sizes of potatoes. Limits sweets. Does not drink sodas.   Objective: Lab Results  Component Value Date   HGBA1C 8.2 (H) 07/30/2023   Medications Reviewed Today   Medications were not reviewed in this encounter    Assessment/Plan:   Diabetes: - Currently uncontrolled based on last A1c value, goal A1c <7.0% - Discussed dietary modifications to help BG control - encouraged reduced portion sizes of carbs, increasing fiber and protein, balanced snacks/meals - Recommend increasing Rybelsus to 14 mg daily - [employer] dose change form signed by PCP and faxed   Follow Up Plan:  11/14 telephone follow up  Arbutus Leas, PharmD, BCPS [facility] [phone]

## 2023-08-01 NOTE — Patient Instructions (Signed)
It was a pleasure speaking with you today!  Increase Rybelsus to 14 mg daily - I have faxed the dose change request to [employer] to send to you in the mail. I recommend continuing 7 mg until you receive it to avoid running out of medication. Once you receive it, you can take 2 tablets of the 7 mg strength until you finish your current supply then start 14 mg tablets just one daily.   I will call you in 4 weeks to see how you're doing on the increased dose.  Feel free to call with any questions or concerns!  Arbutus Leas, PharmD, BCPS [facility] Clinical Pharmacist [facility] [phone]

## 2023-08-08 ENCOUNTER — Telehealth: Payer: Self-pay

## 2023-08-08 NOTE — Telephone Encounter (Signed)
Patient has been informed that his Rybelsus medication has been received and available for pick up. Patient gave a verbal understanding.

## 2023-08-21 ENCOUNTER — Telehealth: Payer: Self-pay

## 2023-08-21 ENCOUNTER — Other Ambulatory Visit (HOSPITAL_COMMUNITY): Payer: Self-pay

## 2023-08-21 NOTE — Telephone Encounter (Signed)
PAP: Application for Rybelsus has been submitted to PAP Companies: [employer], online   PLEASE BE ADVISED I HAVE FAXED PROVIDER PAGES TO Sanda Linger MD BECAUSE THE E-FILE PT PAGES WILL NEED THE PROVIDER PORTION TO COMPLETE PROCESS.

## 2023-08-21 NOTE — Telephone Encounter (Signed)
-----   Message from Perley Jain sent at 08/01/2023 10:44 AM EDT ----- I have sent a dose change from to Novo to increase to 14 mg. Can re-enrollment process be started for this patient?

## 2023-08-23 NOTE — Telephone Encounter (Signed)
No I haven't received anything but this patient just picked up this medication 10/24

## 2023-08-29 ENCOUNTER — Other Ambulatory Visit (INDEPENDENT_AMBULATORY_CARE_PROVIDER_SITE_OTHER): Payer: Self-pay | Admitting: Pharmacist

## 2023-08-29 DIAGNOSIS — E118 Type 2 diabetes mellitus with unspecified complications: Secondary | ICD-10-CM

## 2023-08-29 NOTE — Telephone Encounter (Signed)
PAP: Application for Rybelsus has been submitted to PAP Companies: [employer], via fax  PLEASE BE ADVISED PT PART OF APPLICATION WAS E-FILE AND I HAVE RECEIVED PROVIDER PAGES AND HAVE FAX TO COMPANY FOR PAP PROCESSING

## 2023-08-29 NOTE — Progress Notes (Signed)
Pharmacy Medication Assistance Program Note    08/29/2023  Patient ID: Scott Thomas, male   DOB: [birth year], 42 y.o.   MRN: [id number]     08/21/2023  Outreach Medication One  Initial Outreach Date (Medication One) 08/21/2023  Manufacturer Medication One [employer] [facility] Rybelsus  Dose of Rybelsus 14 MG  Type of Assistance Manufacturer Assistance  Date Application Sent to Patient 08/21/2023  Date Application Sent to Prescriber 09/04/2023  Name of Prescriber Sanda Linger       Signature

## 2023-08-29 NOTE — Progress Notes (Signed)
   08/29/2023 Name: Scott Thomas MRN: [id number] DOB: [birth year]  No chief complaint on file.  Scott Thomas is a 42 y.o. year old male who presented for a telephone visit to follow-up on medication access/adherence and diabetes control    Subjective:  Care Team: Primary Care Provider: Etta Grandchild, MD  **No follow up currently scheduled  Medication Access/Adherence  Current Pharmacy:  [facility] [address] [place] - [address] [place] [address] Phone: [phone] Fax: [phone]  -Patient reports affordability concerns with their medications: No  -Patient reports access/transportation concerns to their pharmacy: No  -Patient reports adherence concerns with their medications:  No    Diabetes: Current medications: Toujeo 20 units daily, Rybelsus 7mg  daily (started ~3 months ago) -Medications tried in the past: Cannot tolerate metformin due to very bad GI side effects -Current glucose readings:  GMI 7.5%, 30 day average: 168 68% time in range -Using Dexcom G7 for CGM -He is not sure if he received the Toujeo PAP application, but he states this is affordable for him at this time -Current medication access support: [employer] PAP for Rybelsus  Diet: working on reducing carb intake - main issue is portion sizes of potatoes. Limits sweets. Does not drink sodas.   Objective: Lab Results  Component Value Date   HGBA1C 8.2 (H) 07/30/2023   Medications Reviewed Today   Medications were not reviewed in this encounter    Assessment/Plan:   Diabetes: - Currently uncontrolled based on last A1c value, goal A1c <7.0% - Discussed dietary modifications to help BG control - encouraged reduced portion sizes of carbs, increasing fiber and protein, balanced snacks/meals - Recommend increasing Rybelsus to 14 mg daily - shipment has been received from PAP so he can go ahead and double up his 7 mg and then switch to the 14 mg 1  tablet daily - Signed Re-enrollment form sent to med assistance team   Follow Up Plan: 12/16 telephone follow up  Arbutus Leas, PharmD, BCPS [facility] [phone]

## 2023-09-30 ENCOUNTER — Other Ambulatory Visit (INDEPENDENT_AMBULATORY_CARE_PROVIDER_SITE_OTHER): Payer: Self-pay | Admitting: Pharmacist

## 2023-09-30 VITALS — BP 125/88

## 2023-09-30 DIAGNOSIS — Z7984 Long term (current) use of oral hypoglycemic drugs: Secondary | ICD-10-CM

## 2023-09-30 DIAGNOSIS — E118 Type 2 diabetes mellitus with unspecified complications: Secondary | ICD-10-CM

## 2023-09-30 NOTE — Progress Notes (Signed)
09/30/2023 Name: Scott Thomas MRN: [id number] DOB: 04-30-1981  Chief Complaint  Patient presents with   Diabetes   Medication Management   Scott Thomas is a 42 y.o. year old male who presented for a telephone visit to follow-up on medication access/adherence and diabetes control  Has not taken Toujeo x1 week No   30 day 7.2%   Subjective:  Care Team: Primary Care Provider: Etta Grandchild, MD  **No follow up currently scheduled  Medication Access/Adherence  Current Pharmacy:  [facility] [address], [place] - [address] [place] [address] Phone: [phone] Fax: [phone]  -Patient reports affordability concerns with their medications: No  -Patient reports access/transportation concerns to their pharmacy: No  -Patient reports adherence concerns with their medications:  No    Diabetes: Current medications: Rybelsus 14 mg daily (started ~1 months ago) *Patient stop Toujeo about 1 week ago because BG were staying down -Medications tried in the past: Cannot tolerate metformin due to very bad GI side effects -Current glucose readings:  BG 120-140, 170 after eating highest. Average 153 over the last 2 weeks, 76% in range, GMI 6.9% GMI 7.2% over last 30 days -Using Dexcom G7 for CGM -He is not sure if he received the Toujeo PAP application, but he states this is affordable for him at this time -Current medication access support: Novo PAP for Rybelsus until 05/14/24  Diet: working on reducing carb intake - main issue is portion sizes of potatoes. Limits sweets. Does not drink sodas.   Objective: Lab Results  Component Value Date   HGBA1C 8.2 (H) 07/30/2023   Medications Reviewed Today     Reviewed by Bonita Quin, RPH (Pharmacist) on 09/30/23 at 1020  Med List Status: <None>   Medication Order Taking? Sig Documenting Provider Last Dose Status Informant  ALPRAZolam (XANAX) 0.25 MG tablet [id number]  Take  1 tablet (0.25 mg total) by mouth 3 (three) times daily as needed for anxiety. Etta Grandchild, MD  Active   amLODipine (NORVASC) 10 MG tablet [id number]  Take 1 tablet by mouth once daily Etta Grandchild, MD  Active   amoxicillin-clavulanate (AUGMENTIN) 875-125 MG tablet [id number]  Take 1 tablet by mouth 2 (two) times daily. Waldon Merl, PA-C  Active   Continuous Glucose Sensor (DEXCOM G7 SENSOR) MISC [id number]  Change sensor every 10 days.  Pharmacy, please apply Dexcom savings:  bin [id number], PCN [id number], Grp [id number], ID [email or web address] Etta Grandchild, MD  Active   insulin glargine, 2 Unit Dial, (TOUJEO MAX SOLOSTAR) 300 UNIT/ML Solostar Pen [id number] No Inject 20 Units into the skin daily.  Patient not taking: Reported on 09/30/2023   Etta Grandchild, MD Not Taking Active   Insulin Pen Needle 32G X 6 MM MISC [id number]  1 Act by Does not apply route daily. Etta Grandchild, MD  Active   ipratropium (ATROVENT) 0.03 % nasal spray [id number]  Place 2 sprays into both nostrils every 12 (twelve) hours. Viviano Simas, FNP  Active   pantoprazole (PROTONIX) 40 MG tablet [id number]  Take 1 tablet (40 mg total) by mouth daily. Etta Grandchild, MD  Active   rosuvastatin (CRESTOR) 5 MG tablet [id number]  Take 1 tablet (5 mg total) by mouth daily. Etta Grandchild, MD  Active   Semaglutide [facility]) 14 MG TABS [id number] Yes Take 1 tablet (14 mg total) by mouth daily. Etta Grandchild, MD Taking Active   sertraline (ZOLOFT) 100  MG tablet [id number]  Take 1 tablet (100 mg total) by mouth daily. Must keep 04/08/23 appt for future refills Etta Grandchild, MD  Active   triamterene-hydrochlorothiazide [facility]) 37.5-25 MG capsule [id number]  Take 1 capsule by mouth once daily Etta Grandchild, MD  Active   Med List Note Domingo Mend, CPhT 03/28/21 1437): [facility] for refill request. Patient has not been seen in office since 10/6. Needs office visit for more refills           BP Readings  from Last 3 Encounters:  09/30/23 125/88  04/23/23 (!) 142/90  10/03/22 138/88    BP 125/88  Assessment/Plan:   Diabetes: - Currently uncontrolled based on last A1c value, goal A1c <7.0% - BG have improved on Rybelsus 14 mg - Discussed dietary modifications to help BG control - encouraged reduced portion sizes of carbs, increasing fiber and protein, balanced snacks/meals - Can remain off of Toujeo if BG are remaining 150 or lower on average. Counseled on avoiding PRN use of Toujeo. Call if BG are increasing to >150 average.  - Pt is due for PCP 6 month follow up in January. Informed him to call and schedule. Due for A1c in January. Will f/u after PCP visit.   Follow Up Plan: 1/30  Arbutus Leas, PharmD, BCPS [facility] [phone]

## 2023-10-08 NOTE — Telephone Encounter (Signed)
PAP: Patient assistance application for Rybelsus has been approved by PAP Companies: NovoNordisk from 04/28/2023 to 04/27/2024. Medication should be delivered to PAP Delivery: Provider's office For further shipping updates, please contact Novo Nordisk at [phone] Pt ID is: [id number]   PLEASE BE ADVISED

## 2023-10-21 ENCOUNTER — Other Ambulatory Visit: Payer: Self-pay | Admitting: Internal Medicine

## 2023-10-21 DIAGNOSIS — I10 Essential (primary) hypertension: Secondary | ICD-10-CM

## 2023-10-23 ENCOUNTER — Other Ambulatory Visit: Payer: Self-pay

## 2023-10-23 ENCOUNTER — Other Ambulatory Visit: Payer: Self-pay | Admitting: Internal Medicine

## 2023-10-23 ENCOUNTER — Telehealth: Payer: Self-pay | Admitting: Internal Medicine

## 2023-10-23 DIAGNOSIS — I10 Essential (primary) hypertension: Secondary | ICD-10-CM

## 2023-10-23 NOTE — Telephone Encounter (Signed)
 Medication refill has been sent to dr Yetta Barre.

## 2023-10-23 NOTE — Telephone Encounter (Signed)
 Pt has called back for an update, states he has not had his medication since Saturday and is beginning to have sxs.

## 2023-10-24 ENCOUNTER — Telehealth: Payer: Self-pay | Admitting: Internal Medicine

## 2023-10-24 ENCOUNTER — Other Ambulatory Visit: Payer: Self-pay | Admitting: Internal Medicine

## 2023-10-24 ENCOUNTER — Ambulatory Visit: Payer: Self-pay | Admitting: Internal Medicine

## 2023-10-24 DIAGNOSIS — I10 Essential (primary) hypertension: Secondary | ICD-10-CM

## 2023-10-24 NOTE — Telephone Encounter (Signed)
  Chief Complaint: high BP readings Symptoms: lethargy, high BP readings Frequency: Since Monday Pertinent Negatives: Patient denies chest pain, SOB, dizziness, headache Disposition: [] ED /[] Urgent Care (no appt availability in office) / [] Appointment(In office/virtual)/ []  [place]/ [] Home Care/ [] Refused Recommended Disposition /[] [place] [facility]/ [x]  Follow-up with PCP Additional Notes: patient c/o high BP readings along with feeling lethargic.  Patient endorses being out of his BP medications since Monday. BP readings are 185/97 and 175/95. Patient states he doesn't feel well and knows when his BP is high. Denies CP, SOB, dizziness and headache. With his symptoms, the recommendation would be for the ED but patient states he would like to speak with PCP and that he needs his medications filled. Patient has made several attempts to get BP medications refilled. Pharmacy told patient that medications were denied. Patient verbalizes understanding and is awaiting a call from staff. All questions answered.  Copied from CRM [id number]. Topic: Clinical - Red Word Triage >> Oct 24, 2023  9:56 AM Rolin D wrote: Red Word that prompted transfer to Nurse Triage: Patient BP is extremely high . Patient is currently out of BP medication . Has requested refill for medication but it is being denied Reason for Disposition  [1] Systolic BP  >= 160 OR Diastolic >= 100 AND [2] cardiac (e.g., breathing difficulty, chest pain) or neurologic symptoms (e.g., new-onset blurred or double vision, unsteady gait)  Answer Assessment - Initial Assessment Questions 1. BLOOD PRESSURE: What is the blood pressure? Did you take at least two measurements 5 minutes apart?     185/97      175/95 2. ONSET: When did you take your blood pressure?     This morning 3. HOW: How did you take your blood pressure? (e.g., automatic home BP monitor, visiting nurse)     Automatic home blood pressure monitor 4.  HISTORY: Do you have a history of high blood pressure?     Yes 5. MEDICINES: Are you taking any medicines for blood pressure? Have you missed any doses recently?     Yes has been taking medications for blood pressure. Has been out of his medications since Monday 6. OTHER SYMPTOMS: Do you have any symptoms? (e.g., blurred vision, chest pain, difficulty breathing, headache, weakness)     Patient endorses lethargy  Protocols used: Blood Pressure - High-A-AH

## 2023-10-24 NOTE — Telephone Encounter (Signed)
 Per medication denial, pcp will not send in refill. Pt needs appt.

## 2023-10-24 NOTE — Telephone Encounter (Signed)
 Per medication denial, pt needs appt and medication will not be sent

## 2023-10-24 NOTE — Telephone Encounter (Unsigned)
 Copied from CRM [id number]. Topic: Clinical - Prescription Issue >> Oct 24, 2023  9:53 AM Rolin D wrote: Reason for CRM: Patient called in stating that the pharmacy has not refilled his blood pressure medication due to a denial from provider . Patient is out of medication has not taken medication since Sunday .

## 2023-10-25 ENCOUNTER — Ambulatory Visit: Payer: Self-pay | Admitting: Internal Medicine

## 2023-10-25 NOTE — Telephone Encounter (Signed)
 Patient is being seen today. Medication will be refilled at the appointment.

## 2023-10-28 ENCOUNTER — Ambulatory Visit: Payer: Self-pay | Admitting: Internal Medicine

## 2023-10-28 NOTE — Telephone Encounter (Signed)
 Reason for Disposition . Requesting regular office appointment  Answer Assessment - Initial Assessment Questions 1. REASON FOR CALL or QUESTION: What is your reason for calling today? or How can I best help you? or What question do you have that I can help answer?     Patient states he went ahead and had another provider prescribe his BP medication. He is asking to schedule his annual physical/wellness visit with Dr Joshua. Confirmed with patient he has enough medication to get him through til his scheduled visit in February with Dr Joshua.  Protocols used: Information Only Call - No Triage-A-AH

## 2023-10-28 NOTE — Telephone Encounter (Addendum)
  Chief Complaint: schedule annual visit  Additional Notes: Patient confirmed he had his BP meds prescribed by another provider and has enough to get him through til his visit with Dr Joshua in February.   Copied from CRM [id number]. Topic: Appointments - Appointment Cancel/Reschedule >> Oct 25, 2023  8:19 AM Victoria A wrote: Patient stated that he has been calling regarding his High Blood Pressure medication for four days and that he went to another physician to have medication prescribed. Patient stated that he was told to come in this morning to see doctor regarding medication. Patient stated that if he was informed earlier on to make appt to come in he would have did so.

## 2023-11-13 ENCOUNTER — Telehealth: Payer: Self-pay

## 2023-11-13 NOTE — Telephone Encounter (Signed)
Medication has been received, patient has been made aware, medication placed up front for pick up.

## 2023-11-14 ENCOUNTER — Other Ambulatory Visit: Payer: Self-pay | Admitting: Pharmacist

## 2023-11-14 DIAGNOSIS — B9789 Other viral agents as the cause of diseases classified elsewhere: Secondary | ICD-10-CM

## 2023-11-14 NOTE — Patient Instructions (Signed)
It was a pleasure speaking with you today!  Continue your current regimen. I'll check to see what your A1c is after you see Dr. Yetta Barre.  Feel free to call with any questions or concerns!  Arbutus Leas, PharmD, BCPS, CPP Clinical Pharmacist Practitioner [facility] [phone]

## 2023-11-14 NOTE — Progress Notes (Signed)
11/14/2023 Name: Scott Thomas MRN: [id number] DOB: 04-27-1981  Chief Complaint  Patient presents with   Diabetes   Medication Management   Scott Thomas is a 43 y.o. year old male who presented for a telephone visit to follow-up on medication access/adherence and diabetes control  Subjective:  Care Team: Primary Care Provider: Etta Grandchild, MD  **No follow up currently scheduled  Medication Access/Adherence  Current Pharmacy:  [facility] [address] [place] - [address] [place] [address] Phone: [phone] Fax: [phone]  -Patient reports affordability concerns with their medications: No  -Patient reports access/transportation concerns to their pharmacy: No  -Patient reports adherence concerns with their medications:  No    Diabetes: Current medications: Rybelsus 14 mg daily (started November 2024) *Patient stop Toujeo about 1 month ago because BG were staying down -Medications tried in the past: Cannot tolerate metformin due to very bad GI side effects -Current glucose readings: Reports BG 140-150, fasting 112-120, highest up to 190 after eating Per Dexcom: Average 158 over the last 2 weeks, 86% in range, GMI 7.1% over 14 days  -Using Dexcom G7 for CGM   -Current medication access support: Novo PAP for Rybelsus until 05/14/24  Diet: working on reducing carb intake - main issue is portion sizes of potatoes. Limits sweets. Does not drink sodas.   Objective: Lab Results  Component Value Date   HGBA1C 8.2 (H) 07/30/2023   Medications Reviewed Today     Reviewed by Bonita Quin, RPH (Pharmacist) on 11/14/23 at 1011  Med List Status: <None>   Medication Order Taking? Sig Documenting Provider Last Dose Status Informant  ALPRAZolam (XANAX) 0.25 MG tablet [id number]  Take 1 tablet (0.25 mg total) by mouth 3 (three) times daily as needed for anxiety. Etta Grandchild, MD  Active   amLODipine (NORVASC) 10  MG tablet [id number]  Take 1 tablet by mouth once daily Etta Grandchild, MD  Active   Continuous Glucose Sensor (DEXCOM G7 SENSOR) MISC [id number] Yes Change sensor every 10 days.  Pharmacy, please apply Dexcom savings:  bin [id number], PCN [id number], Grp [id number], ID [email or web address] Etta Grandchild, MD Taking Active   ipratropium (ATROVENT) 0.03 % nasal spray [id number]  Place 2 sprays into both nostrils every 12 (twelve) hours. Viviano Simas, FNP  Active   pantoprazole (PROTONIX) 40 MG tablet [id number]  Take 1 tablet (40 mg total) by mouth daily. Etta Grandchild, MD  Active   rosuvastatin (CRESTOR) 5 MG tablet [id number]  Take 1 tablet (5 mg total) by mouth daily. Etta Grandchild, MD  Active   Semaglutide [facility]) 14 MG TABS [id number] Yes Take 1 tablet (14 mg total) by mouth daily. Etta Grandchild, MD Taking Active            Med Note Camila Li, Yatzil Clippinger R   Thu Nov 14, 2023 10:11 AM) Getting through patient assistance  sertraline (ZOLOFT) 100 MG tablet [id number]  Take 1 tablet (100 mg total) by mouth daily. Must keep 04/08/23 appt for future refills Etta Grandchild, MD  Active   triamterene-hydrochlorothiazide [facility]) 37.5-25 MG capsule [id number]  Take 1 capsule by mouth once daily Etta Grandchild, MD  Active   Med List Note Domingo Mend, CPhT 03/28/21 1437): [facility] refill [facility]. Patient has not been seen in office since 10/6. Needs office visit for more refills           BP  Readings from Last 3 Encounters:  09/30/23 125/88  04/23/23 (!) 142/90  10/03/22 138/88     Assessment/Plan:   Diabetes: - Currently uncontrolled based on last A1c value, goal A1c <7.0% - BG have improved on Rybelsus 14 mg - Discussed dietary modifications to help BG control - encouraged reduced portion sizes of carbs, increasing fiber and protein, balanced snacks/meals - Can remain off of Toujeo if BG are remaining 150 or lower on average. Counseled on avoiding PRN use of Toujeo. Call  if BG are increasing to >150 average.  - Awaiting next A1c check in February during PCP follow up   Follow Up Plan: 2/24 after A1c  Arbutus Leas, PharmD, BCPS, CPP Clinical Pharmacist Practitioner [place] [facility] [phone]

## 2023-12-09 ENCOUNTER — Encounter: Payer: Self-pay | Admitting: Internal Medicine

## 2023-12-14 ENCOUNTER — Other Ambulatory Visit: Payer: Self-pay | Admitting: Internal Medicine

## 2023-12-14 DIAGNOSIS — E785 Hyperlipidemia, unspecified: Secondary | ICD-10-CM

## 2024-02-13 ENCOUNTER — Telehealth: Payer: Self-pay

## 2024-02-13 NOTE — Telephone Encounter (Signed)
 Patient has been made aware of his Rybelsus  medication being in office. He gave a verbal understanding. Patient has been placed up front for him to pick up

## 2024-02-16 ENCOUNTER — Other Ambulatory Visit: Payer: Self-pay | Admitting: Internal Medicine

## 2024-02-16 DIAGNOSIS — I10 Essential (primary) hypertension: Secondary | ICD-10-CM

## 2024-03-24 ENCOUNTER — Telehealth: Payer: Self-pay

## 2024-03-24 NOTE — Telephone Encounter (Signed)
 Gave pt a call pt is coming up to re enroll on Novo Nordisk (Rybelsus ) pt did not answer and voice mgs is full. Will try calling back again in a few days.

## 2024-03-31 NOTE — Telephone Encounter (Signed)
 Gave pt a call to follow up on pt's pap,left a HIPAA VM. Will follow up in a few days

## 2024-04-08 NOTE — Telephone Encounter (Signed)
 Gave pt a call to follow up on PAP Novo Nordisk (Rybelsus ) pt is coming up for re-enrollment on 04/27/24,I have left 2 voices msg but have not hear back from pt, will try in a couple of days if pt does not call.

## 2024-04-14 NOTE — Telephone Encounter (Signed)
 Gave pt a call to follow up on upcoming pt's assistance Novo Nordisk Rybelsus  on 04/27/24, have call pt and pt has not return the call this time voice msg is full and can not leave msg will let pt call at his earliest convenience.

## 2024-09-03 ENCOUNTER — Telehealth: Payer: Self-pay

## 2024-09-03 NOTE — Telephone Encounter (Signed)
 Copied from CRM [id number]. Topic: Clinical - Medication Question >> Sep 03, 2024  3:37 PM Nessti S wrote: Reason for CRM: pt called to see if med Semaglutide  (RYBELSUS ) 14 MG TAB was available for pick up. Call back number [phone]

## 2024-09-04 NOTE — Telephone Encounter (Signed)
 I advised the patient that I don't have his medication in the office yet but I gave him a 30 day sample of medication to hold him over.

## 2024-09-04 NOTE — Telephone Encounter (Signed)
 Patient son visited to pick this prescription up

## 2024-10-05 ENCOUNTER — Telehealth: Payer: Self-pay

## 2024-10-05 NOTE — Telephone Encounter (Signed)
 Copied from CRM #[id number]. Topic: Clinical - Medication Question >> Oct 05, 2024 12:59 PM Dedra B wrote: Reason for CRM: Pt called to see if his Semaglutide  (RYBELSUS ) 14 MG TABS was ready for pick up or if the clinic had samples if it isn't. Pls call pt at [phone].

## 2024-10-07 ENCOUNTER — Telehealth: Payer: Self-pay | Admitting: Internal Medicine

## 2024-10-07 NOTE — Telephone Encounter (Unsigned)
 Copied from CRM [id number]. Topic: Clinical - Medication Refill >> Oct 07, 2024 10:11 AM Alfonso HERO wrote: Medication: Semaglutide  (RYBELSUS ) 14 MG TABS  Has the patient contacted their pharmacy? Yes (Agent: If no, request that the patient contact the pharmacy for the refill. If patient does not wish to contact the pharmacy document the reason why and proceed with request.) (Agent: If yes, when and what did the pharmacy advise?)  This is the patient's preferred pharmacy:  [facility] [address] [place] - [address] [place] [address] Phone: [phone] Fax: [phone]  Is this the correct pharmacy for this prescription? Yes If no, delete pharmacy and type the correct one.   Has the prescription been filled recently? Yes  Is the patient out of the medication? Yes  Has the patient been seen for an appointment in the last year OR does the patient have an upcoming appointment? Yes  Can we respond through MyChart? Yes  Agent: Please be advised that Rx refills may take up to 3 business days. We ask that you follow-up with your pharmacy.

## 2024-10-07 NOTE — Telephone Encounter (Signed)
 I called the patient to schedule an appt seeing that he hasn't been seen since last year. But he states he doesn't have insurance and that part of the reason he hasn't come in. I informed him that he needs to be seen in order to get the refill or any samples.

## 2024-10-07 NOTE — Telephone Encounter (Signed)
 Who does he see now?

## 2024-10-07 NOTE — Telephone Encounter (Unsigned)
 Copied from CRM #[id number]. Topic: Clinical - Medication Question >> Oct 05, 2024 12:59 PM Dedra B wrote: Reason for CRM: Pt called to see if his Semaglutide  (RYBELSUS ) 14 MG TABS was ready for pick up or if the clinic had samples if it isn't. Pls call pt at [phone]. >> Oct 06, 2024  4:40 PM Tysheama G wrote: Patient is calling again regarding his medication Semaglutide  (RYBELSUS ) ; and he was supposed to received a call yesterday but no one called him and he needs this medication asap due to him being out and havent had it for a couple days now. If we can get that in for him before christmas.

## 2024-10-09 NOTE — Telephone Encounter (Signed)
Can you help me with this?

## 2024-10-12 NOTE — Telephone Encounter (Signed)
 Patient has been scheduled for an appointment with Dr. Joshua

## 2024-10-20 ENCOUNTER — Ambulatory Visit: Payer: Self-pay | Admitting: Internal Medicine
